# Patient Record
Sex: Male | Born: 1990 | Hispanic: No | Marital: Single | Smoking: Never smoker
Health system: Southern US, Community
[De-identification: ages and names within clinical notes are randomized; demographics above are authoritative.]

## PROBLEM LIST (undated history)

## (undated) DIAGNOSIS — Q211 Atrial septal defect, unspecified: Secondary | ICD-10-CM

## (undated) DIAGNOSIS — E119 Type 2 diabetes mellitus without complications: Secondary | ICD-10-CM

## (undated) DIAGNOSIS — Q2111 Secundum atrial septal defect: Secondary | ICD-10-CM

## (undated) HISTORY — PX: ATRIAL SEPTAL DEFECT(ASD) CLOSURE: CATH118299

## (undated) HISTORY — DX: Atrial septal defect: Q21.1

## (undated) HISTORY — DX: Secundum atrial septal defect: Q21.11

---

## 2011-02-20 ENCOUNTER — Inpatient Hospital Stay (INDEPENDENT_AMBULATORY_CARE_PROVIDER_SITE_OTHER)
Admission: RE | Admit: 2011-02-20 | Discharge: 2011-02-20 | Disposition: A | Discharge status: Home / Self Care | Payer: Self-pay | Source: Ambulatory Visit | Attending: Family Medicine | Admitting: Family Medicine

## 2011-02-20 DIAGNOSIS — E119 Type 2 diabetes mellitus without complications: Secondary | ICD-10-CM

## 2012-12-11 ENCOUNTER — Ambulatory Visit: Payer: Self-pay | Admitting: Endocrinology

## 2012-12-18 ENCOUNTER — Ambulatory Visit (INDEPENDENT_AMBULATORY_CARE_PROVIDER_SITE_OTHER): Payer: 59 | Admitting: Endocrinology

## 2012-12-18 ENCOUNTER — Encounter: Payer: Self-pay | Admitting: Endocrinology

## 2012-12-18 VITALS — BP 126/80 | HR 80 | Ht 68.0 in | Wt 166.0 lb

## 2012-12-18 DIAGNOSIS — E109 Type 1 diabetes mellitus without complications: Secondary | ICD-10-CM

## 2012-12-18 MED ORDER — INSULIN GLARGINE 100 UNIT/ML ~~LOC~~ SOLN: 25.0000 [IU] | Freq: Every day | SUBCUTANEOUS | Status: DC

## 2012-12-18 MED ORDER — INSULIN LISPRO 100 UNIT/ML ~~LOC~~ SOLN: 12.0000 [IU] | Freq: Three times a day (TID) | SUBCUTANEOUS | Status: DC

## 2012-12-18 NOTE — Progress Notes (Signed)
  Subjective:    Patient ID: Kenneth James, male    DOB: 01-31-1991, 22 y.o.   MRN: [id number]  HPI pt states 15 years h/o dm.  He has mild if any neuropathy of the lower extremities; no associated chronic complications.  pt says his diet and exercise are good.  He quit smoking a few months ago.  No past medical history on file.  No past surgical history on file.  History   Social History  . Marital Status: Single    Spouse Name: N/A    Number of Children: N/A  . Years of Education: N/A   Occupational History  . Not on file.   Social History Main Topics  . Smoking status: Not on file  . Smokeless tobacco: Not on file  . Alcohol Use: Not on file  . Drug Use: Not on file  . Sexually Active: Not on file   Other Topics Concern  . Not on file   Social History Narrative  . No narrative on file    No current outpatient prescriptions on file prior to visit.   No current facility-administered medications on file prior to visit.   Not on File  No family history on file. Sister has type 1 DM BP 126/80  Pulse 80  Ht 5\' 8"  (1.727 m)  Wt 166 lb (75.297 kg)  BMI 25.25 kg/m2  SpO2 95%  Review of Systems denies blurry vision, headache, chest pain, sob, n/v, urinary frequency, cramps, excessive diaphoresis, depression, rhinorrhea, and easy bruising.  He has lost a few lbs, due to his efforts.  He has hypoglycemia with exercise.      Objective:   Physical Exam VS: see vs page GEN: no distress HEAD: head: no deformity eyes: no periorbital swelling, no proptosis external nose and ears are normal mouth: no lesion seen NECK: supple, thyroid is not enlarged CHEST WALL: no deformity LUNGS: clear to auscultation BREASTS:  No gynecomastia CV: reg rate and rhythm, no murmur ABD: abdomen is soft, nontender.  no hepatosplenomegaly.  not distended.  no hernia MUSCULOSKELETAL: muscle bulk and strength are grossly normal.  no obvious joint swelling.  gait is normal and  steady EXTEMITIES: no deformity.  no ulcer on the feet.  feet are of normal color and temp.  no edema PULSES: dorsalis pedis intact bilat.  no carotid bruit NEURO:  cn 2-12 grossly intact.   readily moves all 4's.  sensation is intact to touch on the feet SKIN:  Normal texture and temperature.  No rash or suspicious lesion is visible.   NODES:  None palpable at the neck PSYCH: alert, oriented x3.  Does not appear anxious nor depressed.   outside test results are reviewed: A1c=7.6%    Assessment & Plan:  Type 1 DM: This new insulin regimen was chosen from multiple options, as it best matches his insulin to his changing requirements throughout the day.  The benefits of glycemic control must be weighed against the risks of hypoglycemia.   Weight loss.  This helps control his DM Smoker, quit. Quitting also helps mitigate complications of DM.

## 2012-12-18 NOTE — Patient Instructions (Addendum)
good diet and exercise habits significanly improve the control of your diabetes.  please let me know if you wish to be referred to a dietician.  high blood sugar is very risky to your health.  you should see an eye doctor every year.  You are at higher than average risk for pneumonia and hepatitis-B.  You should be vaccinated against both.   controlling your blood pressure and cholesterol drastically reduces the damage diabetes does to your body.  this also applies to quitting smoking.  please discuss these with your doctor.  check your blood sugar 4 times a day: before the 3 meals, and at bedtime.  also check if you have symptoms of your blood sugar being too high or too low.  please keep a record of the readings and bring it to your next appointment here.  please call us sooner if your blood sugar goes below 70, or if you have a lot of readings over 200.   Please change your current insulin to: Lantus and humalog Please come back for a follow-up appointment in 3-4 weeks.

## 2012-12-30 ENCOUNTER — Encounter: Payer: Self-pay | Admitting: Family Medicine

## 2013-01-15 ENCOUNTER — Ambulatory Visit: Payer: 59 | Admitting: Endocrinology

## 2013-01-15 DIAGNOSIS — Z0289 Encounter for other administrative examinations: Secondary | ICD-10-CM

## 2013-03-22 ENCOUNTER — Emergency Department (HOSPITAL_COMMUNITY)
Admission: EM | Admit: 2013-03-22 | Discharge: 2013-03-22 | Disposition: A | Discharge status: Home / Self Care | Payer: Managed Care, Other (non HMO)

## 2013-03-22 NOTE — ED Notes (Signed)
No answer when called pt; pt not in waiting room

## 2013-03-22 NOTE — ED Notes (Signed)
Pt left and went to [facility]; need to be removed so can be triaged at [facility]

## 2013-03-23 ENCOUNTER — Encounter (HOSPITAL_COMMUNITY): Payer: Self-pay | Admitting: *Deleted

## 2013-03-23 ENCOUNTER — Emergency Department (HOSPITAL_COMMUNITY): Payer: Managed Care, Other (non HMO)

## 2013-03-23 ENCOUNTER — Emergency Department (HOSPITAL_COMMUNITY)
Admission: EM | Admit: 2013-03-23 | Discharge: 2013-03-24 | Disposition: A | Discharge status: Home / Self Care | Payer: Managed Care, Other (non HMO) | Attending: Emergency Medicine | Admitting: Emergency Medicine

## 2013-03-23 DIAGNOSIS — R079 Chest pain, unspecified: Secondary | ICD-10-CM | POA: Insufficient documentation

## 2013-03-23 DIAGNOSIS — J209 Acute bronchitis, unspecified: Secondary | ICD-10-CM | POA: Insufficient documentation

## 2013-03-23 DIAGNOSIS — R7309 Other abnormal glucose: Secondary | ICD-10-CM | POA: Insufficient documentation

## 2013-03-23 DIAGNOSIS — R739 Hyperglycemia, unspecified: Secondary | ICD-10-CM

## 2013-03-23 DIAGNOSIS — J208 Acute bronchitis due to other specified organisms: Secondary | ICD-10-CM

## 2013-03-23 DIAGNOSIS — E119 Type 2 diabetes mellitus without complications: Secondary | ICD-10-CM | POA: Insufficient documentation

## 2013-03-23 DIAGNOSIS — Z794 Long term (current) use of insulin: Secondary | ICD-10-CM | POA: Insufficient documentation

## 2013-03-23 HISTORY — DX: Type 2 diabetes mellitus without complications: E11.9

## 2013-03-23 NOTE — ED Notes (Addendum)
Pt states productive cough and URI type symptoms for the past 2 days. Pt states nasal congestion as well. Pt denies fever or chills within the last 24 hours.

## 2013-03-24 LAB — GLUCOSE, CAPILLARY: Glucose-Capillary: 259 mg/dL — ABNORMAL HIGH (ref 70–99)

## 2013-03-24 LAB — POCT I-STAT, CHEM 8
BUN: 22 mg/dL (ref 6–23)
Calcium, Ion: 0.9 mmol/L — ABNORMAL LOW (ref 1.12–1.23)
Chloride: 107 mEq/L (ref 96–112)
Creatinine, Ser: 0.8 mg/dL (ref 0.50–1.35)
Glucose, Bld: 211 mg/dL — ABNORMAL HIGH (ref 70–99)

## 2013-03-24 MED ORDER — ALBUTEROL SULFATE HFA 108 (90 BASE) MCG/ACT IN AERS
2.0000 | INHALATION_SPRAY | Freq: Once | RESPIRATORY_TRACT | Status: AC
  Administered 2013-03-24: 2 via RESPIRATORY_TRACT
  Filled 2013-03-24 (×2): qty 6.7

## 2013-03-24 MED ORDER — HYDROCOD POLST-CHLORPHEN POLST 10-8 MG/5ML PO LQCR: 5.0000 mL | Freq: Two times a day (BID) | ORAL | Status: DC | PRN

## 2013-03-24 NOTE — ED Notes (Signed)
Pt states that he feels light headed CBG checked

## 2013-03-24 NOTE — ED Provider Notes (Signed)
Medical screening examination/treatment/procedure(s) were performed by non-physician practitioner and as supervising physician I was immediately available for consultation/collaboration.  Olivia Mackie, MD 03/24/13 [phone]

## 2013-03-24 NOTE — ED Provider Notes (Signed)
CSN: [id number]     Arrival date & time 03/23/13  2223 History   None    Chief Complaint  Patient presents with  . URI   (Consider location/radiation/quality/duration/timing/severity/associated sxs/prior Treatment) HPI History provided by pt.   Pt presents w/ cough, productive of yellow sputum, x 2 days.  Has diffuse pain in chest when he coughs.  Associated w/ nasal congestion and wheezing, as well as shortness of breath upon waking in am.  Denies fever, rhinorrhea, sore throat and hemoptysis.  Known sick contacts.  Smokes cigarettes.  H/o Type 1 diabetes; compliant w/ meds. Past Medical History  Diagnosis Date  . Diabetes mellitus without complication    History reviewed. No pertinent past surgical history. History reviewed. No pertinent family history. History  Substance Use Topics  . Smoking status: Never Smoker   . Smokeless tobacco: Not on file  . Alcohol Use: No    Review of Systems  All other systems reviewed and are negative.    Allergies  Review of patient's allergies indicates no known allergies.  Home Medications   Current Outpatient Rx  Name  Route  Sig  Dispense  Refill  . insulin glargine (LANTUS) 100 UNIT/ML injection   Subcutaneous   Inject 0.25 mLs (25 Units total) into the skin at bedtime.   10 mL   12   . insulin NPH-regular (NOVOLIN 70/30) (70-30) 100 UNIT/ML injection   Subcutaneous   Inject 40 Units into the skin 2 (two) times daily with a meal.         . chlorpheniramine-HYDROcodone (TUSSIONEX PENNKINETIC ER) 10-8 MG/5ML LQCR   Oral   Take 5 mLs by mouth every 12 (twelve) hours as needed.   40 mL   0    BP 132/77  Pulse 112  Temp(Src) 98.1 F (36.7 C) (Oral)  Resp 16  SpO2 96% Physical Exam  Nursing note and vitals reviewed. Constitutional: He is oriented to person, place, and time. He appears well-developed and well-nourished. No distress.  HENT:  Head: Normocephalic and atraumatic.  Posterior pharynx w/out erythema.  No  tonsillar edema or exudate.  Uvula mid-line.  No trismus.    Eyes:  Normal appearance  Neck: Normal range of motion.  Cardiovascular: Normal rate and regular rhythm.   Pulmonary/Chest: Effort normal and breath sounds normal. No respiratory distress.  Musculoskeletal: Normal range of motion.  Lymphadenopathy:    He has cervical adenopathy.  Neurological: He is alert and oriented to person, place, and time.  Skin: Skin is warm and dry. No rash noted.  Psychiatric: He has a normal mood and affect. His behavior is normal.    ED Course  Procedures (including critical care time) Labs Review Labs Reviewed  GLUCOSE, CAPILLARY - Abnormal; Notable for the following:    Glucose-Capillary 259 (*)    All other components within normal limits  GLUCOSE, CAPILLARY - Abnormal; Notable for the following:    Glucose-Capillary 201 (*)    All other components within normal limits  POCT I-STAT, CHEM 8 - Abnormal; Notable for the following:    Glucose, Bld 211 (*)    Calcium, Ion 0.90 (*)    All other components within normal limits   Imaging Review Dg Chest 2 View  03/23/2013   CLINICAL DATA:  Chest congestion.  EXAM: CHEST  2 VIEW  COMPARISON:  None.  FINDINGS: The cardiac silhouette, mediastinal and hilar contours are normal. Mild peribronchial thickening and slight increased interstitial markings suggesting bronchitis but no focal infiltrates or  effusions. The bony thorax is intact.  IMPRESSION: Probable mild bronchitis. No focal infiltrates.   Electronically Signed   By: Loralie Champagne M.D.   On: 03/23/2013 22:51    MDM   1. Viral bronchitis   2. Hyperglycemia    22yo type 1 diabetic presents w/ cough, wheezing, nasal congestion as well as SOB upon waking in am x 2d.  Afebrile, no respiratory distress, no coughing, nml breath sounds on exam.  CXR consistent w/ bronchitis.  Results discussed w/ pt.  Cbg 259.  Hyperglycemic w/out acidosis.  Has been compliant w/ meds.  Recommended f/u with PCP.   Provided him w/ albuterol inhaler and prescribed short course of tussionex.  He will try to cut back on smoking.    Otilio Miu, PA-C 03/24/13 [phone]

## 2013-03-24 NOTE — ED Notes (Signed)
Educated Pt on indications and use of inhaler

## 2013-03-24 NOTE — ED Notes (Signed)
Pt states that he is feeling less dizzy.

## 2013-03-24 NOTE — ED Notes (Signed)
Pt a current smoker

## 2013-07-08 ENCOUNTER — Emergency Department (HOSPITAL_COMMUNITY)
Admission: EM | Admit: 2013-07-08 | Discharge: 2013-07-08 | Disposition: A | Discharge status: Home / Self Care | Payer: Managed Care, Other (non HMO) | Attending: Emergency Medicine | Admitting: Emergency Medicine

## 2013-07-08 ENCOUNTER — Encounter (HOSPITAL_COMMUNITY): Payer: Self-pay | Admitting: Emergency Medicine

## 2013-07-08 ENCOUNTER — Emergency Department (HOSPITAL_COMMUNITY): Payer: Managed Care, Other (non HMO)

## 2013-07-08 DIAGNOSIS — E119 Type 2 diabetes mellitus without complications: Secondary | ICD-10-CM | POA: Insufficient documentation

## 2013-07-08 DIAGNOSIS — F17200 Nicotine dependence, unspecified, uncomplicated: Secondary | ICD-10-CM | POA: Insufficient documentation

## 2013-07-08 DIAGNOSIS — J111 Influenza due to unidentified influenza virus with other respiratory manifestations: Secondary | ICD-10-CM

## 2013-07-08 DIAGNOSIS — Z794 Long term (current) use of insulin: Secondary | ICD-10-CM | POA: Insufficient documentation

## 2013-07-08 MED ORDER — IBUPROFEN 800 MG PO TABS
800.0000 mg | ORAL_TABLET | Freq: Once | ORAL | Status: AC
  Administered 2013-07-08: 800 mg via ORAL
  Filled 2013-07-08: qty 1

## 2013-07-08 MED ORDER — ALBUTEROL SULFATE HFA 108 (90 BASE) MCG/ACT IN AERS
2.0000 | INHALATION_SPRAY | RESPIRATORY_TRACT | Status: DC | PRN
  Filled 2013-07-08: qty 6.7

## 2013-07-08 MED ORDER — CETIRIZINE-PSEUDOEPHEDRINE ER 5-120 MG PO TB12: 1.0000 | ORAL_TABLET | Freq: Two times a day (BID) | ORAL | Status: DC

## 2013-07-08 MED ORDER — GUAIFENESIN ER 600 MG PO TB12: 1200.0000 mg | ORAL_TABLET | Freq: Two times a day (BID) | ORAL | Status: DC

## 2013-07-08 MED ORDER — OSELTAMIVIR PHOSPHATE 75 MG PO CAPS: 75.0000 mg | ORAL_CAPSULE | Freq: Two times a day (BID) | ORAL | Status: DC

## 2013-07-08 NOTE — ED Provider Notes (Signed)
CSN: [id number]     Arrival date & time 07/08/13  1926 History  This chart was scribed for non-physician practitioner, Ivonne AndrewPeter Joren Rehm, PA-C,working with Rolland PorterMark James, MD, by Karle PlumberJennifer Tensley, ED Scribe.  This patient was seen in room WTR9/WTR9 and the patient's care was started at 8:19 PM.  Chief Complaint  Patient presents with  . Influenza   The history is provided by the patient. No language interpreter was used.   HPI Comments:  Kenneth James is a 23 y.o. male who presents to the Emergency Department complaining of  severe coughing for approximately 2 weeks. He states in the past three days his cough has become productive. He reports associated subjective fever starting yesterday, headache secondary to cough, and generalized muscle weakness and sore throat. He reports taking a medication and cough medication [facility], but cannot remember the name. He states he felt worse after taking the medications. He reports smoking 1 PPD. He reports h/o DM.    Past Medical History  Diagnosis Date  . Diabetes mellitus without complication    History reviewed. No pertinent past surgical history. Family History  Problem Relation Age of Onset  . Diabetes Other    History  Substance Use Topics  . Smoking status: Current Every Day Smoker    Types: Cigarettes  . Smokeless tobacco: Not on file  . Alcohol Use: Yes     Comment: occ    Review of Systems  Constitutional: Positive for fever.       Generalized body aches  HENT: Positive for congestion and sore throat.   Respiratory: Positive for cough.   Gastrointestinal: Negative for vomiting.       Reports loose bowel movements today.  Neurological: Positive for weakness (generalized) and headaches.  All other systems reviewed and are negative.    Allergies  Review of patient's allergies indicates no known allergies.  Home Medications   Current Outpatient Rx  Name  Route  Sig  Dispense  Refill  .  chlorpheniramine-HYDROcodone (TUSSIONEX PENNKINETIC ER) 10-8 MG/5ML LQCR   Oral   Take 5 mLs by mouth every 12 (twelve) hours as needed.   40 mL   0   . insulin glargine (LANTUS) 100 UNIT/ML injection   Subcutaneous   Inject 0.25 mLs (25 Units total) into the skin at bedtime.   10 mL   12   . insulin NPH-regular (NOVOLIN 70/30) (70-30) 100 UNIT/ML injection   Subcutaneous   Inject 40 Units into the skin 2 (two) times daily with a meal.          Triage Vitals: BP 119/68  Pulse 94  Temp(Src) 99.5 F (37.5 C) (Oral)  Resp 15  Ht 5\' 9"  (1.753 m)  Wt 170 lb (77.111 kg)  BMI 25.09 kg/m2  SpO2 96% Physical Exam  Nursing note and vitals reviewed. Constitutional: He is oriented to person, place, and time. He appears well-developed and well-nourished.  HENT:  Head: Normocephalic and atraumatic.  Right Ear: Hearing, tympanic membrane, external ear and ear canal normal.  Left Ear: Hearing, tympanic membrane, external ear and ear canal normal.  Mouth/Throat: Uvula is midline and mucous membranes are normal. Posterior oropharyngeal erythema present. No oropharyngeal exudate, posterior oropharyngeal edema or tonsillar abscesses.  Tonsillar cobblestoning.  Eyes: EOM are normal.  Neck: Normal range of motion.  Cardiovascular: Normal rate, regular rhythm and normal heart sounds.  Exam reveals no gallop and no friction rub.   No murmur heard. Pulmonary/Chest: Effort  normal. No respiratory distress. He has wheezes (Mild course wheezing in right lung.). He has no rales. He exhibits no tenderness.  Musculoskeletal: Normal range of motion.  Neurological: He is alert and oriented to person, place, and time.  Skin: Skin is warm and dry.  Psychiatric: He has a normal mood and affect. His behavior is normal.    ED Course  Procedures    DIAGNOSTIC STUDIES: Oxygen Saturation is 96% on RA, adequate by my interpretation.   COORDINATION OF CARE: 8:29 PM- Advised pt to take Tylenol or  Ibuprofen for fever. Since pt is unsure of medications that were prescribed, will obtain CXR to rule out pneumonia. Pt verbalizes understanding and agrees to plan.  Imaging Review Dg Chest 2 View  07/08/2013   CLINICAL DATA:  Cough, congestion, fever  EXAM: CHEST  2 VIEW  COMPARISON:  Prior chest x-ray 03/23/2013  FINDINGS: The lungs are clear and negative for focal airspace consolidation, pulmonary edema or suspicious pulmonary nodule. No pleural effusion or pneumothorax. Cardiac and mediastinal contours are within normal limits. No acute fracture or lytic or blastic osseous lesions. The visualized upper abdominal bowel gas pattern is unremarkable.  IMPRESSION: No active cardiopulmonary disease.   Electronically Signed   By: Malachy Moan M.D.   On: 07/08/2013 20:42     MDM   1. Influenza      I personally performed the services described in this documentation, which was scribed in my presence. The recorded information has been reviewed and is accurate.    Angus Seller, PA-C 07/08/13 2057

## 2013-07-08 NOTE — ED Notes (Signed)
Pt states he has had cold like symptoms for the past two weeks but started running a fever two days ago  Pt states his sxs include cough, fever, body aches, nasal congestion  Pt states his bowel movements have been loose today but has not had vomiting

## 2013-07-08 NOTE — Discharge Instructions (Signed)
Your x-ray was normal today. At this time your providers feel that your fever, cough and congestion are caused from a viral infection and possibly the flu. Drink plenty of fluids and stay hydrated. Use the medications prescribed to help with your symptoms. Take Tylenol and ibuprofen for fever and body aches.    Influenza, Adult Influenza ("the flu") is a viral infection of the respiratory tract. It occurs more often in winter months because people spend more time in close contact with one another. Influenza can make you feel very sick. Influenza easily spreads from person to person (contagious). CAUSES  Influenza is caused by a virus that infects the respiratory tract. You can catch the virus by breathing in droplets from an infected person's cough or sneeze. You can also catch the virus by touching something that was recently contaminated with the virus and then touching your mouth, nose, or eyes. SYMPTOMS  Symptoms typically last 4 to 10 days and may include:  Fever.  Chills.  Headache, body aches, and muscle aches.  Sore throat.  Chest discomfort and cough.  Poor appetite.  Weakness or feeling tired.  Dizziness.  Nausea or vomiting. DIAGNOSIS  Diagnosis of influenza is often made based on your history and a physical exam. A nose or throat swab test can be done to confirm the diagnosis. RISKS AND COMPLICATIONS You may be at risk for a more severe case of influenza if you smoke cigarettes, have diabetes, have chronic heart disease (such as heart failure) or lung disease (such as asthma), or if you have a weakened immune system. Elderly people and pregnant women are also at risk for more serious infections. The most common complication of influenza is a lung infection (pneumonia). Sometimes, this complication can require emergency medical care and may be life-threatening. PREVENTION  An annual influenza vaccination (flu shot) is the best way to avoid getting influenza. An annual flu  shot is now routinely recommended for all adults in the [place] TREATMENT  In mild cases, influenza goes away on its own. Treatment is directed at relieving symptoms. For more severe cases, your caregiver may prescribe antiviral medicines to shorten the sickness. Antibiotic medicines are not effective, because the infection is caused by a virus, not by bacteria. HOME CARE INSTRUCTIONS  Only take over-the-counter or prescription medicines for pain, discomfort, or fever as directed by your caregiver.  Use a cool mist humidifier to make breathing easier.  Get plenty of rest until your temperature returns to normal. This usually takes 3 to 4 days.  Drink enough fluids to keep your urine clear or pale yellow.  Cover your mouth and nose when coughing or sneezing, and wash your hands well to avoid spreading the virus.  Stay home from work or school until your fever has been gone for at least 1 full day. SEEK MEDICAL CARE IF:   You have chest pain or a deep cough that worsens or produces more mucus.  You have nausea, vomiting, or diarrhea. SEEK IMMEDIATE MEDICAL CARE IF:   You have difficulty breathing, shortness of breath, or your skin or nails turn bluish.  You have severe neck pain or stiffness.  You have a severe headache, facial pain, or earache.  You have a worsening or recurring fever.  You have nausea or vomiting that cannot be controlled. MAKE SURE YOU:  Understand these instructions.  Will watch your condition.  Will get help right away if you are not doing well or get worse. Document Released: 06/08/2000 Document Revised:  12/11/2011 Document Reviewed: 09/10/2011 ExitCare Patient Information 2014 [place], [place].

## 2013-07-08 NOTE — ED Notes (Signed)
Patient transported to X-ray 

## 2013-07-11 NOTE — ED Provider Notes (Signed)
Medical screening examination/treatment/procedure(s) were performed by non-physician practitioner and as supervising physician I was immediately available for consultation/collaboration.  EKG Interpretation   None         Ariaunna Longsworth, MD 07/11/13 0711 

## 2013-07-19 ENCOUNTER — Encounter (HOSPITAL_COMMUNITY): Payer: Self-pay | Admitting: Emergency Medicine

## 2013-07-19 ENCOUNTER — Emergency Department (HOSPITAL_COMMUNITY)
Admission: EM | Admit: 2013-07-19 | Discharge: 2013-07-20 | Discharge status: Against Medical Advice | Payer: Managed Care, Other (non HMO) | Attending: Emergency Medicine | Admitting: Emergency Medicine

## 2013-07-19 DIAGNOSIS — F17200 Nicotine dependence, unspecified, uncomplicated: Secondary | ICD-10-CM | POA: Insufficient documentation

## 2013-07-19 DIAGNOSIS — E119 Type 2 diabetes mellitus without complications: Secondary | ICD-10-CM | POA: Insufficient documentation

## 2013-07-19 DIAGNOSIS — Z760 Encounter for issue of repeat prescription: Secondary | ICD-10-CM | POA: Insufficient documentation

## 2013-07-19 DIAGNOSIS — Z794 Long term (current) use of insulin: Secondary | ICD-10-CM | POA: Insufficient documentation

## 2013-07-19 NOTE — ED Notes (Signed)
Per pt, needs refill script for insulin 70/30.  Out of med since yesterday.  [facility]

## 2013-09-16 ENCOUNTER — Encounter (HOSPITAL_COMMUNITY): Payer: Self-pay | Admitting: Emergency Medicine

## 2013-09-16 ENCOUNTER — Emergency Department (HOSPITAL_COMMUNITY)
Admission: EM | Admit: 2013-09-16 | Discharge: 2013-09-16 | Disposition: A | Discharge status: Home / Self Care | Payer: Managed Care, Other (non HMO) | Attending: Emergency Medicine | Admitting: Emergency Medicine

## 2013-09-16 DIAGNOSIS — Z794 Long term (current) use of insulin: Secondary | ICD-10-CM | POA: Insufficient documentation

## 2013-09-16 DIAGNOSIS — E1169 Type 2 diabetes mellitus with other specified complication: Secondary | ICD-10-CM | POA: Insufficient documentation

## 2013-09-16 DIAGNOSIS — Z79899 Other long term (current) drug therapy: Secondary | ICD-10-CM | POA: Insufficient documentation

## 2013-09-16 DIAGNOSIS — E162 Hypoglycemia, unspecified: Secondary | ICD-10-CM

## 2013-09-16 DIAGNOSIS — R51 Headache: Secondary | ICD-10-CM | POA: Insufficient documentation

## 2013-09-16 DIAGNOSIS — F17200 Nicotine dependence, unspecified, uncomplicated: Secondary | ICD-10-CM | POA: Insufficient documentation

## 2013-09-16 DIAGNOSIS — R5383 Other fatigue: Secondary | ICD-10-CM

## 2013-09-16 DIAGNOSIS — R5381 Other malaise: Secondary | ICD-10-CM | POA: Insufficient documentation

## 2013-09-16 LAB — CBG MONITORING, ED
GLUCOSE-CAPILLARY: 112 mg/dL — AB (ref 70–99)
GLUCOSE-CAPILLARY: 53 mg/dL — AB (ref 70–99)
Glucose-Capillary: 59 mg/dL — ABNORMAL LOW (ref 70–99)
Glucose-Capillary: 93 mg/dL (ref 70–99)

## 2013-09-16 MED ORDER — IBUPROFEN 200 MG PO TABS
400.0000 mg | ORAL_TABLET | Freq: Once | ORAL | Status: AC
Start: 2013-09-16 — End: 2013-09-16
  Administered 2013-09-16: 400 mg via ORAL
  Filled 2013-09-16: qty 2

## 2013-09-16 MED ORDER — GLUCOSE 40 % PO GEL
ORAL | Status: AC
  Administered 2013-09-16: 37.5 g
  Filled 2013-09-16: qty 1

## 2013-09-16 NOTE — Discharge Instructions (Signed)

## 2013-09-16 NOTE — ED Notes (Signed)
Pt reports he woke up with a headache 6/10, then gave himself his insulin 70/30 40 units. Then pt checked his blood sugar at home 45. Pt drank juice and then came to ED. In ED cbg 53. Oral glucose given. Pt alert and oriented x4.

## 2013-09-16 NOTE — ED Provider Notes (Signed)
CSN: [id number]     Arrival date & time 09/16/13  1033 History   First MD Initiated Contact with Patient 09/16/13 1156     Chief Complaint  Patient presents with  . Hypoglycemia  . Headache     (Consider location/radiation/quality/duration/timing/severity/associated sxs/prior Treatment) HPI Comments: Patient presents with low blood sugar. He has a history of insulin-dependent diabetes. He states he woke up this morning and had a little bit of a headache which she's had in the past. He routinely took his insulin shot and then didn't eat after that. He felt that his blood sugar was getting low he checked it and it was in the 50s. He came here for evaluation. His sugar was low in triage and was given some oral glucose. He felt much better after that. He was ready to go home and wanted to be discharged immediately. However when we went back to check his sugar it dropped from 112 back down into the 50s. At this point I encouraged him to stay a little but longer and he was given a sandwich and was also given juice with this. He denies any other symptoms. He states the headache is in the right side of his head and he had similar headaches in the past. He denies any neck pain fevers vomiting or vision changes. He has no neurologic deficits.  Patient is a 23 y.o. male presenting with hypoglycemia and headaches.  Hypoglycemia Associated symptoms: no dizziness, no shortness of breath, no speech difficulty, no sweats and no vomiting   Headache Associated symptoms: fatigue   Associated symptoms: no abdominal pain, no back pain, no congestion, no cough, no diarrhea, no dizziness, no fever, no nausea, no numbness and no vomiting     Past Medical History  Diagnosis Date  . Diabetes mellitus without complication    History reviewed. No pertinent past surgical history. Family History  Problem Relation Age of Onset  . Diabetes Other    History  Substance Use Topics  . Smoking status: Current Every Day  Smoker    Types: Cigarettes  . Smokeless tobacco: Not on file  . Alcohol Use: Yes     Comment: occ    Review of Systems  Constitutional: Positive for fatigue. Negative for fever, chills and diaphoresis.  HENT: Negative for congestion, rhinorrhea and sneezing.   Eyes: Negative.   Respiratory: Negative for cough, chest tightness and shortness of breath.   Cardiovascular: Negative for chest pain and leg swelling.  Gastrointestinal: Negative for nausea, vomiting, abdominal pain, diarrhea and blood in stool.  Genitourinary: Negative for frequency, hematuria, flank pain and difficulty urinating.  Musculoskeletal: Negative for arthralgias and back pain.  Skin: Negative for rash.  Neurological: Positive for headaches. Negative for dizziness, speech difficulty, weakness and numbness.      Allergies  Review of patient's allergies indicates no known allergies.  Home Medications   Current Outpatient Rx  Name  Route  Sig  Dispense  Refill  . BIOTIN PO   Oral   Take 1 tablet by mouth daily.         . insulin NPH-regular (NOVOLIN 70/30) (70-30) 100 UNIT/ML injection   Subcutaneous   Inject 40 Units into the skin 2 (two) times daily with a meal.          BP 112/65  Pulse 90  Temp(Src) 97.8 F (36.6 C) (Oral)  Resp 18  SpO2 100% Physical Exam  Constitutional: He is oriented to person, place, and time. He appears well-developed and  well-nourished.  HENT:  Head: Normocephalic and atraumatic.  Eyes: Pupils are equal, round, and reactive to light.  Neck: Normal range of motion. Neck supple.  Cardiovascular: Normal rate, regular rhythm and normal heart sounds.   Pulmonary/Chest: Effort normal and breath sounds normal. No respiratory distress. He has no wheezes. He has no rales. He exhibits no tenderness.  Abdominal: Soft. Bowel sounds are normal. There is no tenderness. There is no rebound and no guarding.  Musculoskeletal: Normal range of motion. He exhibits no edema.   Lymphadenopathy:    He has no cervical adenopathy.  Neurological: He is alert and oriented to person, place, and time. He has normal strength. No cranial nerve deficit or sensory deficit. GCS eye subscore is 4. GCS verbal subscore is 5. GCS motor subscore is 6.  Skin: Skin is warm and dry. No rash noted.  Psychiatric: He has a normal mood and affect.    ED Course  Procedures (including critical care time) Labs Review Labs Reviewed  CBG MONITORING, ED - Abnormal; Notable for the following:    Glucose-Capillary 53 (*)    All other components within normal limits  CBG MONITORING, ED - Abnormal; Notable for the following:    Glucose-Capillary 112 (*)    All other components within normal limits  CBG MONITORING, ED - Abnormal; Notable for the following:    Glucose-Capillary 59 (*)    All other components within normal limits  CBG MONITORING, ED   Imaging Review No results found.   EKG Interpretation None      MDM   Final diagnoses:  Hypoglycemia    Patient presents with hypoglycemia. This is related to them taken insulin in the evening. He did have a repeat episode of hypoglycemia in the ED which resolved 38 something. I did encourage him that he should have a longer period of observation in the ED but he refused. He said he had to get to class and he was going to get something to eat as soon as he leaves the ED. His headache is slmost completely resolved since this morning.    Rolan BuccoMelanie Donyell Carrell, MD 09/16/13 [phone]

## 2013-10-24 ENCOUNTER — Emergency Department (HOSPITAL_COMMUNITY): Payer: Managed Care, Other (non HMO)

## 2013-10-24 ENCOUNTER — Encounter (HOSPITAL_COMMUNITY): Payer: Self-pay | Admitting: Emergency Medicine

## 2013-10-24 ENCOUNTER — Emergency Department (HOSPITAL_COMMUNITY)
Admission: EM | Admit: 2013-10-24 | Discharge: 2013-10-24 | Disposition: A | Discharge status: Home / Self Care | Payer: Managed Care, Other (non HMO) | Attending: Emergency Medicine | Admitting: Emergency Medicine

## 2013-10-24 DIAGNOSIS — J069 Acute upper respiratory infection, unspecified: Secondary | ICD-10-CM

## 2013-10-24 DIAGNOSIS — Z794 Long term (current) use of insulin: Secondary | ICD-10-CM | POA: Insufficient documentation

## 2013-10-24 DIAGNOSIS — F17200 Nicotine dependence, unspecified, uncomplicated: Secondary | ICD-10-CM | POA: Insufficient documentation

## 2013-10-24 DIAGNOSIS — E119 Type 2 diabetes mellitus without complications: Secondary | ICD-10-CM | POA: Insufficient documentation

## 2013-10-24 DIAGNOSIS — Z79899 Other long term (current) drug therapy: Secondary | ICD-10-CM | POA: Insufficient documentation

## 2013-10-24 MED ORDER — GUAIFENESIN 100 MG/5ML PO LIQD: 100.0000 mg | ORAL | Status: DC | PRN

## 2013-10-24 MED ORDER — GUAIFENESIN 100 MG/5ML PO SYRP
5.0000 mL | ORAL_SOLUTION | Freq: Once | ORAL | Status: AC
  Administered 2013-10-24: 5 mL via ORAL
  Filled 2013-10-24: qty 5

## 2013-10-24 MED ORDER — ALBUTEROL SULFATE HFA 108 (90 BASE) MCG/ACT IN AERS
2.0000 | INHALATION_SPRAY | Freq: Once | RESPIRATORY_TRACT | Status: DC
  Filled 2013-10-24: qty 6.7

## 2013-10-24 NOTE — ED Notes (Signed)
Pt states he feels like he has inflammation in his chest and a cough  Pt states sxs started yesterday and are worse today

## 2013-10-24 NOTE — Discharge Instructions (Signed)
Please follow up with your primary care physician in 1-2 days. If you do not have one please call the [facility] number listed above. Please use inhaler 2 puffs every four to six hours to help with cough and chest tightness. Please take Robitussin as prescribed. Please finish your course of antibiotics for your strep throat as provided by a previous provider. Please read all discharge instructions and return precautions.   Upper Respiratory Infection, Adult An upper respiratory infection (URI) is also sometimes known as the common cold. The upper respiratory tract includes the nose, sinuses, throat, trachea, and bronchi. Bronchi are the airways leading to the lungs. Most people improve within 1 week, but symptoms can last up to 2 weeks. A residual cough may last even longer.  CAUSES Many different viruses can infect the tissues lining the upper respiratory tract. The tissues become irritated and inflamed and often become very moist. Mucus production is also common. A cold is contagious. You can easily spread the virus to others by oral contact. This includes kissing, sharing a glass, coughing, or sneezing. Touching your mouth or nose and then touching a surface, which is then touched by another person, can also spread the virus. SYMPTOMS  Symptoms typically develop 1 to 3 days after you come in contact with a cold virus. Symptoms vary from person to person. They may include:  Runny nose.  Sneezing.  Nasal congestion.  Sinus irritation.  Sore throat.  Loss of voice (laryngitis).  Cough.  Fatigue.  Muscle aches.  Loss of appetite.  Headache.  Low-grade fever. DIAGNOSIS  You might diagnose your own cold based on familiar symptoms, since most people get a cold 2 to 3 times a year. Your caregiver can confirm this based on your exam. Most importantly, your caregiver can check that your symptoms are not due to another disease such as strep throat, sinusitis, pneumonia,  asthma, or epiglottitis. Blood tests, throat tests, and X-rays are not necessary to diagnose a common cold, but they may sometimes be helpful in excluding other more serious diseases. Your caregiver will decide if any further tests are required. RISKS AND COMPLICATIONS  You may be at risk for a more severe case of the common cold if you smoke cigarettes, have chronic heart disease (such as heart failure) or lung disease (such as asthma), or if you have a weakened immune system. The very young and very old are also at risk for more serious infections. Bacterial sinusitis, middle ear infections, and bacterial pneumonia can complicate the common cold. The common cold can worsen asthma and chronic obstructive pulmonary disease (COPD). Sometimes, these complications can require emergency medical care and may be life-threatening. PREVENTION  The best way to protect against getting a cold is to practice good hygiene. Avoid oral or hand contact with people with cold symptoms. Wash your hands often if contact occurs. There is no clear evidence that vitamin C, vitamin E, echinacea, or exercise reduces the chance of developing a cold. However, it is always recommended to get plenty of rest and practice good nutrition. TREATMENT  Treatment is directed at relieving symptoms. There is no cure. Antibiotics are not effective, because the infection is caused by a virus, not by bacteria. Treatment may include:  Increased fluid intake. Sports drinks offer valuable electrolytes, sugars, and fluids.  Breathing heated mist or steam (vaporizer or shower).  Eating chicken soup or other clear broths, and maintaining good nutrition.  Getting plenty of rest.  Using gargles or  lozenges for comfort.  Controlling fevers with ibuprofen or acetaminophen as directed by your caregiver.  Increasing usage of your inhaler if you have asthma. Zinc gel and zinc lozenges, taken in the first 24 hours of the common cold, can shorten the  duration and lessen the severity of symptoms. Pain medicines may help with fever, muscle aches, and throat pain. A variety of non-prescription medicines are available to treat congestion and runny nose. Your caregiver can make recommendations and may suggest nasal or lung inhalers for other symptoms.  HOME CARE INSTRUCTIONS   Only take over-the-counter or prescription medicines for pain, discomfort, or fever as directed by your caregiver.  Use a warm mist humidifier or inhale steam from a shower to increase air moisture. This may keep secretions moist and make it easier to breathe.  Drink enough water and fluids to keep your urine clear or pale yellow.  Rest as needed.  Return to work when your temperature has returned to normal or as your caregiver advises. You may need to stay home longer to avoid infecting others. You can also use a face mask and careful hand washing to prevent spread of the virus. SEEK MEDICAL CARE IF:   After the first few days, you feel you are getting worse rather than better.  You need your caregiver's advice about medicines to control symptoms.  You develop chills, worsening shortness of breath, or brown or red sputum. These may be signs of pneumonia.  You develop yellow or brown nasal discharge or pain in the face, especially when you bend forward. These may be signs of sinusitis.  You develop a fever, swollen neck glands, pain with swallowing, or white areas in the back of your throat. These may be signs of strep throat. SEEK IMMEDIATE MEDICAL CARE IF:   You have a fever.  You develop severe or persistent headache, ear pain, sinus pain, or chest pain.  You develop wheezing, a prolonged cough, cough up blood, or have a change in your usual mucus (if you have chronic lung disease).  You develop sore muscles or a stiff neck. Document Released: 12/05/2000 Document Revised: 09/03/2011 Document Reviewed: 10/13/2010 [facility] Patient Information 2014 [place].

## 2013-10-24 NOTE — ED Provider Notes (Signed)
CSN: [id number]     Arrival date & time 10/24/13  2004 History   First MD Initiated Contact with Patient 10/24/13 2015     Chief Complaint  Patient presents with  . Cough     (Consider location/radiation/quality/duration/timing/severity/associated sxs/prior Treatment) HPI Comments: Patient is a 23 year old male past medical history significant for DM presented to the emergency department for 2 days of productive cough with yellow sputum, post tussive chest tightness, nasal congestion and rhinorrhea. Patient states he was seen at an urgent care and diagnosed with strep pharyngitis and started on an antibiotic. This is been taking this without difficulty. He states he is concerned he may have a pneumonia or other chest infection. He has not tried any over-the-counter cough and cold medications. No aggravating factors noted. He denies any fevers, chills, nausea, vomiting, diarrhea.  Patient is a 23 y.o. male presenting with cough.  Cough Associated symptoms: rhinorrhea   Associated symptoms: no chest pain, no chills, no fever and no shortness of breath     Past Medical History  Diagnosis Date  . Diabetes mellitus without complication    History reviewed. No pertinent past surgical history. Family History  Problem Relation Age of Onset  . Diabetes Other   . Diabetes Sister    History  Substance Use Topics  . Smoking status: Current Every Day Smoker    Types: Cigarettes  . Smokeless tobacco: Not on file  . Alcohol Use: Yes     Comment: occ    Review of Systems  Constitutional: Negative for fever and chills.  HENT: Positive for congestion and rhinorrhea.   Respiratory: Positive for cough and chest tightness. Negative for shortness of breath.   Cardiovascular: Negative for chest pain.  All other systems reviewed and are negative.     Allergies  Review of patient's allergies indicates no known allergies.  Home Medications   Prior to Admission medications   Medication Sig  Start Date End Date Taking? Authorizing Provider  insulin NPH-regular (NOVOLIN 70/30) (70-30) 100 UNIT/ML injection Inject 40 Units into the skin 2 (two) times daily with a meal.   Yes Historical Provider, MD  MINOXIDIL, TOPICAL, (MINOXIDIL FOR MEN) 5 % SOLN Apply 1 application topically daily.   Yes Historical Provider, MD   BP 123/67  Pulse 90  Temp(Src) 98.1 F (36.7 C) (Oral)  Resp 18  SpO2 98% Physical Exam  Nursing note and vitals reviewed. Constitutional: He is oriented to person, place, and time. He appears well-developed and well-nourished. No distress.  HENT:  Head: Normocephalic and atraumatic.  Right Ear: Hearing, tympanic membrane, external ear and ear canal normal. No mastoid tenderness.  Left Ear: Hearing, tympanic membrane, external ear and ear canal normal. No mastoid tenderness.  Nose: Rhinorrhea present. No mucosal edema, nose lacerations, sinus tenderness, nasal deformity, septal deviation or nasal septal hematoma. No epistaxis.  No foreign bodies. Right sinus exhibits no maxillary sinus tenderness and no frontal sinus tenderness. Left sinus exhibits no maxillary sinus tenderness and no frontal sinus tenderness.  Mouth/Throat: Uvula is midline, oropharynx is clear and moist and mucous membranes are normal. No trismus in the jaw. No tonsillar abscesses.  Eyes: Conjunctivae are normal.  Neck: Normal range of motion. Neck supple.  Cardiovascular: Normal rate, regular rhythm and normal heart sounds.   Pulmonary/Chest: Effort normal and breath sounds normal. No respiratory distress. He has no wheezes. He has no rales. He exhibits no tenderness.  Abdominal: Soft. There is no tenderness.  Musculoskeletal: Normal range of motion.  Neurological: He is alert and oriented to person, place, and time.  Skin: Skin is warm and dry. He is not diaphoretic.  Psychiatric: He has a normal mood and affect.    ED Course  Procedures (including critical care time) Medications  albuterol  (PROVENTIL HFA;VENTOLIN HFA) 108 (90 BASE) MCG/ACT inhaler 2 puff (not administered)  guaifenesin (ROBITUSSIN) 100 MG/5ML syrup 5 mL (5 mLs Oral Given 10/24/13 2158)    Labs Review Labs Reviewed - No data to display  Imaging Review Dg Chest 2 View  10/24/2013   CLINICAL DATA:  Cough, congestion, shortness of breath.  Smoker.  EXAM: CHEST  2 VIEW  COMPARISON:  DG CHEST 2 VIEW dated 07/08/2013  FINDINGS: The heart size and mediastinal contours are within normal limits. Both lungs are clear. The visualized skeletal structures are unremarkable.  IMPRESSION: No active cardiopulmonary disease.   Electronically Signed   By: Burman NievesWilliam  Stevens M.D.   On: 10/24/2013 21:05     EKG Interpretation None      MDM   Final diagnoses:  URI (upper respiratory infection)    Filed Vitals:   10/24/13 2010  BP: 123/67  Pulse: 90  Temp: 98.1 F (36.7 C)  Resp: 18   Afebrile, NAD, non-toxic appearing, AAOx4.  Pt CXR negative for acute infiltrate. Patients symptoms are consistent with URI, likely viral etiology. Discussed that antibiotics are not indicated for viral infections. Pt will be discharged with symptomatic treatment.  Verbalizes understanding and is agreeable with plan. Pt is hemodynamically stable & in NAD prior to dc. Return precautions discussed. Patient is agreeable to plan. Patient is stable at time of discharge.         Jeannetta EllisJennifer L Haven Foss, PA-C 10/24/13 2249

## 2013-10-25 NOTE — ED Provider Notes (Signed)
Medical screening examination/treatment/procedure(s) were performed by non-physician practitioner and as supervising physician I was immediately available for consultation/collaboration.   EKG Interpretation None        Adrine Hayworth H Malvika Tung, MD 10/25/13 1500 

## 2013-11-21 ENCOUNTER — Encounter (HOSPITAL_COMMUNITY): Payer: Self-pay | Admitting: Emergency Medicine

## 2013-11-21 ENCOUNTER — Emergency Department (HOSPITAL_COMMUNITY)
Admission: EM | Admit: 2013-11-21 | Discharge: 2013-11-21 | Disposition: A | Discharge status: Home / Self Care | Payer: Managed Care, Other (non HMO) | Attending: Emergency Medicine | Admitting: Emergency Medicine

## 2013-11-21 DIAGNOSIS — E109 Type 1 diabetes mellitus without complications: Secondary | ICD-10-CM | POA: Insufficient documentation

## 2013-11-21 DIAGNOSIS — R000 Tachycardia, unspecified: Secondary | ICD-10-CM | POA: Insufficient documentation

## 2013-11-21 DIAGNOSIS — F17200 Nicotine dependence, unspecified, uncomplicated: Secondary | ICD-10-CM | POA: Insufficient documentation

## 2013-11-21 DIAGNOSIS — Z760 Encounter for issue of repeat prescription: Secondary | ICD-10-CM | POA: Insufficient documentation

## 2013-11-21 DIAGNOSIS — Z794 Long term (current) use of insulin: Secondary | ICD-10-CM | POA: Insufficient documentation

## 2013-11-21 DIAGNOSIS — Z79899 Other long term (current) drug therapy: Secondary | ICD-10-CM | POA: Insufficient documentation

## 2013-11-21 MED ORDER — INSULIN NPH ISOPHANE & REGULAR (70-30) 100 UNIT/ML ~~LOC~~ SUSP: 40.0000 [IU] | Freq: Two times a day (BID) | SUBCUTANEOUS | Status: DC

## 2013-11-21 MED ORDER — INSULIN ASPART PROT & ASPART (70-30 MIX) 100 UNIT/ML ~~LOC~~ SUSP
40.0000 [IU] | Freq: Once | SUBCUTANEOUS | Status: AC
  Administered 2013-11-21: 40 [IU] via SUBCUTANEOUS
  Filled 2013-11-21: qty 10

## 2013-11-21 NOTE — ED Provider Notes (Signed)
Medical screening examination/treatment/procedure(s) were performed by non-physician practitioner and as supervising physician I was immediately available for consultation/collaboration.   EKG Interpretation None        Audree Camel, MD 11/21/13 [phone]

## 2013-11-21 NOTE — ED Provider Notes (Signed)
CSN: [id number]     Arrival date & time 11/21/13  1635 History   First MD Initiated Contact with Patient 11/21/13 1650     Chief Complaint  Patient presents with  . Medication Refill     (Consider location/radiation/quality/duration/timing/severity/associated sxs/prior Treatment) HPI  23 year old male with history of type 1 diabetes that is insulin-dependent who presents requesting for medication refill. Patient states he ran out of his insulin yesterday. He does have a primary care Dr. but office is closed today. He has no significant complaints except for being a little bit tired. He did not have his morning dose. He takes insulin 70/30, 40 units in the morning, and 30 units at night. He denies having headache, chest pain, shortness of breath, or pain, nausea vomiting diarrhea. No recent sickness.  Past Medical History  Diagnosis Date  . Diabetes mellitus without complication    No past surgical history on file. Family History  Problem Relation Age of Onset  . Diabetes Other   . Diabetes Sister    History  Substance Use Topics  . Smoking status: Current Every Day Smoker    Types: Cigarettes  . Smokeless tobacco: Not on file  . Alcohol Use: Yes     Comment: occ    Review of Systems  All other systems reviewed and are negative.     Allergies  Review of patient's allergies indicates no known allergies.  Home Medications   Prior to Admission medications   Medication Sig Start Date End Date Taking? Authorizing Provider  guaiFENesin (ROBITUSSIN) 100 MG/5ML liquid Take 5-10 mLs (100-200 mg total) by mouth every 4 (four) hours as needed for cough. 10/24/13   Jennifer L Piepenbrink, PA-C  insulin NPH-regular (NOVOLIN 70/30) (70-30) 100 UNIT/ML injection Inject 40 Units into the skin 2 (two) times daily with a meal.    Historical Provider, MD  MINOXIDIL, TOPICAL, (MINOXIDIL FOR MEN) 5 % SOLN Apply 1 application topically daily.    Historical Provider, MD   There were no vitals  taken for this visit. Physical Exam  Constitutional: He is oriented to person, place, and time. He appears well-developed and well-nourished. No distress.  HENT:  Head: Atraumatic.  Mouth/Throat: Oropharynx is clear and moist.  Eyes: Conjunctivae are normal.  Neck: Normal range of motion. Neck supple.  Cardiovascular:  Mild tachycardia with M/R/G  Pulmonary/Chest: Effort normal and breath sounds normal.  Abdominal: Soft.  Neurological: He is alert and oriented to person, place, and time. GCS eye subscore is 4. GCS verbal subscore is 5. GCS motor subscore is 6.  Skin: No rash noted.  Psychiatric: He has a normal mood and affect.    ED Course  Procedures (including critical care time)  4:58 PM Pt here for medication refill.  Will refill his insulin.  Pt will f/u with his PCP.  He's mentating appropriately, doubt DKA.  Since he missed his morning dose, will give a dose here. Pt to resume his medication.    Labs Review Labs Reviewed - No data to display  Imaging Review No results found.   EKG Interpretation None      MDM   Final diagnoses:  Encounter for medication refill    BP 143/67  Pulse 110  Temp(Src) 98.1 F (36.7 C) (Oral)  Resp 18  SpO2 100%     Fayrene Helper, PA-C 11/21/13 1659

## 2013-11-21 NOTE — Discharge Instructions (Signed)
Medication Refill, Emergency Department  We have refilled your medication today as a courtesy to you. It is best for your medical care, however, to take care of getting refills done through your primary caregiver's office. They have your records and can do a better job of follow-up than we can in the emergency department.  On maintenance medications, we often only prescribe enough medications to get you by until you are able to see your regular caregiver. This is a more expensive way to refill medications.  In the future, please plan for refills so that you will not have to use the emergency department for this.  Thank you for your help. Your help allows us to better take care of the daily emergencies that enter our department.  Document Released: 09/28/2003 Document Revised: 09/03/2011 Document Reviewed: 06/11/2005  ExitCare® Patient Information ©2014 [employer].

## 2013-11-21 NOTE — ED Notes (Signed)
Pt states that he wants a prescription of 70/30 insulin.  Has not taken it today.  States that he usually goes to the clinic at [facility] but they were closed.

## 2013-11-22 ENCOUNTER — Ambulatory Visit (HOSPITAL_COMMUNITY)
Admission: EM | Admit: 2013-11-22 | Discharge: 2013-11-22 | Discharge status: Against Medical Advice | Payer: Managed Care, Other (non HMO) | Attending: Emergency Medicine | Admitting: Emergency Medicine

## 2013-11-22 DIAGNOSIS — R1031 Right lower quadrant pain: Secondary | ICD-10-CM | POA: Insufficient documentation

## 2013-11-22 LAB — CBG MONITORING, ED: Glucose-Capillary: 87 mg/dL (ref 70–99)

## 2013-12-25 ENCOUNTER — Emergency Department (HOSPITAL_COMMUNITY)
Admission: EM | Admit: 2013-12-25 | Discharge: 2013-12-25 | Disposition: A | Discharge status: Home / Self Care | Payer: Managed Care, Other (non HMO) | Attending: Emergency Medicine | Admitting: Emergency Medicine

## 2013-12-25 DIAGNOSIS — E119 Type 2 diabetes mellitus without complications: Secondary | ICD-10-CM | POA: Insufficient documentation

## 2013-12-25 DIAGNOSIS — R1031 Right lower quadrant pain: Secondary | ICD-10-CM | POA: Insufficient documentation

## 2013-12-25 DIAGNOSIS — Z794 Long term (current) use of insulin: Secondary | ICD-10-CM | POA: Insufficient documentation

## 2013-12-25 DIAGNOSIS — F17200 Nicotine dependence, unspecified, uncomplicated: Secondary | ICD-10-CM | POA: Insufficient documentation

## 2013-12-25 DIAGNOSIS — R109 Unspecified abdominal pain: Secondary | ICD-10-CM

## 2013-12-25 DIAGNOSIS — Z79899 Other long term (current) drug therapy: Secondary | ICD-10-CM | POA: Insufficient documentation

## 2013-12-25 LAB — CBC
HCT: 44.3 % (ref 39.0–52.0)
Hemoglobin: 16.5 g/dL (ref 13.0–17.0)
MCH: 31.3 pg (ref 26.0–34.0)
MCHC: 37.2 g/dL — ABNORMAL HIGH (ref 30.0–36.0)
MCV: 84.1 fL (ref 78.0–100.0)
PLATELETS: 311 10*3/uL (ref 150–400)
RBC: 5.27 MIL/uL (ref 4.22–5.81)
RDW: 11.6 % (ref 11.5–15.5)
WBC: 6.9 10*3/uL (ref 4.0–10.5)

## 2013-12-25 LAB — COMPREHENSIVE METABOLIC PANEL
ALBUMIN: 4.6 g/dL (ref 3.5–5.2)
ALK PHOS: 103 U/L (ref 39–117)
ALT: 18 U/L (ref 0–53)
AST: 21 U/L (ref 0–37)
Anion gap: 12 (ref 5–15)
BUN: 11 mg/dL (ref 6–23)
CHLORIDE: 98 meq/L (ref 96–112)
CO2: 24 meq/L (ref 19–32)
CREATININE: 0.78 mg/dL (ref 0.50–1.35)
Calcium: 9.4 mg/dL (ref 8.4–10.5)
GFR calc Af Amer: >90 mL/min (ref >90)
GFR calc non Af Amer: >90 mL/min (ref >90)
Glucose, Bld: 284 mg/dL — ABNORMAL HIGH (ref 70–99)
POTASSIUM: 4.2 meq/L (ref 3.7–5.3)
SODIUM: 134 meq/L — AB (ref 137–147)
Total Bilirubin: 0.9 mg/dL (ref 0.3–1.2)
Total Protein: 7.9 g/dL (ref 6.0–8.3)

## 2013-12-25 LAB — URINE MICROSCOPIC-ADD ON

## 2013-12-25 LAB — URINALYSIS, ROUTINE W REFLEX MICROSCOPIC
Bilirubin Urine: NEGATIVE
Glucose, UA: >1000 mg/dL — AB
Hgb urine dipstick: NEGATIVE
Ketones, ur: 15 mg/dL — AB
LEUKOCYTES UA: NEGATIVE
NITRITE: NEGATIVE
PROTEIN: NEGATIVE mg/dL
Specific Gravity, Urine: 1.009 (ref 1.005–1.030)
UROBILINOGEN UA: 0.2 mg/dL (ref 0.0–1.0)
pH: 6.5 (ref 5.0–8.0)

## 2013-12-25 LAB — CBG MONITORING, ED: GLUCOSE-CAPILLARY: 270 mg/dL — AB (ref 70–99)

## 2013-12-25 MED ORDER — NAPROXEN 250 MG PO TABS
500.0000 mg | ORAL_TABLET | Freq: Once | ORAL | Status: AC
  Administered 2013-12-25: 500 mg via ORAL
  Filled 2013-12-25: qty 2

## 2013-12-25 MED ORDER — TRAMADOL HCL 50 MG PO TABS: 50.0000 mg | ORAL_TABLET | Freq: Four times a day (QID) | ORAL | Status: DC | PRN

## 2013-12-25 MED ORDER — NAPROXEN 500 MG PO TABS: 500.0000 mg | ORAL_TABLET | Freq: Two times a day (BID) | ORAL | Status: DC

## 2013-12-25 NOTE — ED Notes (Signed)
Pt reports: Pt reports R sided flank pain x1 month, reports pain is the worst it ever has been today. Ax4, NAD. Denies blood in his urine or difficulty urinating.

## 2013-12-25 NOTE — ED Provider Notes (Signed)
CSN: [id number]     Arrival date & time 12/25/13  1504 History   First MD Initiated Contact with Patient 12/25/13 1714     Chief Complaint  Patient presents with  . Flank Pain     (Consider location/radiation/quality/duration/timing/severity/associated sxs/prior Treatment) Patient is a 23 y.o. male presenting with flank pain. The history is provided by the patient.  Flank Pain  He is complaining of pain in the right flank which started this morning. Pain is dull and he rates it at 7/10. Nothing makes it better nothing makes it worse. There is no associated nausea or vomiting. He denies any fever or chills. He has noted that he is urinating more than normal but thinks his blood sugar may be elevated. He denies any recent, or heavy lifting. He is concerned about his kidney because she states that he drinks heavily and is concerned about the interaction of his drinking and his diabetes. He also has a cigarette smoker. He has not taken any medication to treat his pain.  Past Medical History  Diagnosis Date  . Diabetes mellitus without complication    No past surgical history on file. Family History  Problem Relation Age of Onset  . Diabetes Other   . Diabetes Sister    History  Substance Use Topics  . Smoking status: Current Every Day Smoker    Types: Cigarettes  . Smokeless tobacco: Not on file  . Alcohol Use: Yes     Comment: occ    Review of Systems  Genitourinary: Positive for flank pain.  All other systems reviewed and are negative.     Allergies  Review of patient's allergies indicates no known allergies.  Home Medications   Prior to Admission medications   Medication Sig Start Date End Date Taking? Authorizing Provider  guaiFENesin (ROBITUSSIN) 100 MG/5ML liquid Take 5-10 mLs (100-200 mg total) by mouth every 4 (four) hours as needed for cough. 10/24/13  Yes Jennifer L Piepenbrink, PA-C  insulin NPH-regular Human (NOVOLIN 70/30) (70-30) 100 UNIT/ML injection Inject  20-30 Units into the skin 2 (two) times daily with a meal. 30 units every morning and 20 units at night 11/21/13  Yes Fayrene HelperBowie Tran, PA-C  MINOXIDIL, TOPICAL, (MINOXIDIL FOR MEN) 5 % SOLN Apply 1 application topically daily.   Yes Historical Provider, MD  Multiple Vitamins-Minerals (MULTIVITAMIN WITH MINERALS) tablet Take 1 tablet by mouth daily.   Yes Historical Provider, MD  Pyridoxine HCl (VITAMIN B-6) 250 MG tablet Take 250 mg by mouth daily.   Yes Historical Provider, MD   BP 135/75  Pulse 88  Temp(Src) 98.3 F (36.8 C) (Oral)  Resp 17  Wt 154 lb 5.2 oz (70 kg)  SpO2 99% Physical Exam  Nursing note and vitals reviewed.  23 year old male, resting comfortably and in no acute distress. Vital signs are normal. Oxygen saturation is 99%, which is normal. Head is normocephalic and atraumatic. PERRLA, EOMI. Oropharynx is clear. Neck is nontender and supple without adenopathy or JVD. Back is nontender and there is no CVA tenderness. Lungs are clear without rales, wheezes, or rhonchi. Chest is nontender. Heart has regular rate and rhythm without murmur. Abdomen is soft, flat, nontender without masses or hepatosplenomegaly and peristalsis is normoactive. Extremities have no cyanosis or edema, full range of motion is present. Skin is warm and dry without rash. Neurologic: Mental status is normal, cranial nerves are intact, there are no motor or sensory deficits.  ED Course  Procedures (including critical care time) Labs Review ,  Results for orders placed during the hospital encounter of 12/25/13  URINALYSIS, ROUTINE W REFLEX MICROSCOPIC      Result Value Ref Range   Color, Urine YELLOW  YELLOW   APPearance CLEAR  CLEAR   Specific Gravity, Urine 1.009  1.005 - 1.030   pH 6.5  5.0 - 8.0   Glucose, UA >1000 (*) NEGATIVE mg/dL   Hgb urine dipstick NEGATIVE  NEGATIVE   Bilirubin Urine NEGATIVE  NEGATIVE   Ketones, ur 15 (*) NEGATIVE mg/dL   Protein, ur NEGATIVE  NEGATIVE mg/dL    Urobilinogen, UA 0.2  0.0 - 1.0 mg/dL   Nitrite NEGATIVE  NEGATIVE   Leukocytes, UA NEGATIVE  NEGATIVE  CBC      Result Value Ref Range   WBC 6.9  4.0 - 10.5 K/uL   RBC 5.27  4.22 - 5.81 MIL/uL   Hemoglobin 16.5  13.0 - 17.0 g/dL   HCT 96.044.3  45.439.0 - 09.852.0 %   MCV 84.1  78.0 - 100.0 fL   MCH 31.3  26.0 - 34.0 pg   MCHC 37.2 (*) 30.0 - 36.0 g/dL   RDW 11.911.6  14.711.5 - 82.915.5 %   Platelets 311  150 - 400 K/uL  COMPREHENSIVE METABOLIC PANEL      Result Value Ref Range   Sodium 134 (*) 137 - 147 mEq/L   Potassium 4.2  3.7 - 5.3 mEq/L   Chloride 98  96 - 112 mEq/L   CO2 24  19 - 32 mEq/L   Glucose, Bld 284 (*) 70 - 99 mg/dL   BUN 11  6 - 23 mg/dL   Creatinine, Ser 5.620.78  0.50 - 1.35 mg/dL   Calcium 9.4  8.4 - 13.010.5 mg/dL   Total Protein 7.9  6.0 - 8.3 g/dL   Albumin 4.6  3.5 - 5.2 g/dL   AST 21  0 - 37 U/L   ALT 18  0 - 53 U/L   Alkaline Phosphatase 103  39 - 117 U/L   Total Bilirubin 0.9  0.3 - 1.2 mg/dL   GFR calc non Af Amer >90  >90 mL/min   GFR calc Af Amer >90  >90 mL/min   Anion gap 12  5 - 15  URINE MICROSCOPIC-ADD ON      Result Value Ref Range   Squamous Epithelial / LPF RARE  RARE   Bacteria, UA RARE  RARE  CBG MONITORING, ED      Result Value Ref Range   Glucose-Capillary 270 (*) 70 - 99 mg/dL   Comment 1 Documented in Chart     Comment 2 Notify RN      MDM   Final diagnoses:  Right flank pain    Right flank pain of uncertain cause. No evidence of UTI, urolithiasis. Most likely, pain is musculoskeletal. He will be given prescriptions for naproxen and tramadol. He is encouraged to stop drinking and stop smoking.    Dione Boozeavid Zenith Kercheval, MD 12/25/13 [phone]

## 2013-12-25 NOTE — Discharge Instructions (Signed)
Flank Pain Flank pain refers to pain that is located on the side of the body between the upper abdomen and the back. The pain may occur over a short period of time (acute) or may be long-term or reoccurring (chronic). It may be mild or severe. Flank pain can be caused by many things. CAUSES  Some of the more common causes of flank pain include:  Muscle strains.   Muscle spasms.   A disease of your spine (vertebral disk disease).   A lung infection (pneumonia).   Fluid around your lungs (pulmonary edema).   A kidney infection.   Kidney stones.   A very painful skin rash caused by the chickenpox virus (shingles).   Gallbladder disease.  HOME CARE INSTRUCTIONS  Home care will depend on the cause of your pain. In general,  Rest as directed by your caregiver.  Drink enough fluids to keep your urine clear or pale yellow.  Only take over-the-counter or prescription medicines as directed by your caregiver. Some medicines may help relieve the pain.  Tell your caregiver about any changes in your pain.  Follow up with your caregiver as directed. SEEK IMMEDIATE MEDICAL CARE IF:   Your pain is not controlled with medicine.   You have new or worsening symptoms.  Your pain increases.   You have abdominal pain.   You have shortness of breath.   You have persistent nausea or vomiting.   You have swelling in your abdomen.   You feel faint or pass out.   You have blood in your urine.  You have a fever or persistent symptoms for more than 2-3 days.  You have a fever and your symptoms suddenly get worse. MAKE SURE YOU:   Understand these instructions.  Will watch your condition.  Will get help right away if you are not doing well or get worse. Document Released: 08/02/2005 Document Revised: 03/05/2012 Document Reviewed: 01/24/2012 [facility] Patient Information 2015 [place], [place]. This information is not intended to replace advice given to you by your  health care provider. Make sure you discuss any questions you have with your health care provider.  Smoking Hazards Smoking cigarettes is extremely bad for your health. Tobacco smoke has over 200 known poisons in it. It contains the poisonous gases nitrogen oxide and carbon monoxide. There are over 60 chemicals in tobacco smoke that cause cancer. Some of the chemicals found in cigarette smoke include:   Cyanide.   Benzene.   Formaldehyde.   Methanol (wood alcohol).   Acetylene (fuel used in welding torches).   Ammonia.  Even smoking lightly shortens your life expectancy by several years. You can greatly reduce the risk of medical problems for you and your family by stopping now. Smoking is the most preventable cause of death and disease in our society. Within days of quitting smoking, your circulation improves, you decrease the risk of having a heart attack, and your lung capacity improves. There may be some increased phlegm in the first few days after quitting, and it may take months for your lungs to clear up completely. Quitting for 10 years reduces your risk of developing lung cancer to almost that of a nonsmoker.  WHAT ARE THE RISKS OF SMOKING? Cigarette smokers have an increased risk of many serious medical problems, including:  Lung cancer.   Lung disease (such as pneumonia, bronchitis, and emphysema).   Heart attack and chest pain due to the heart not getting enough oxygen (angina).   Heart disease and peripheral blood  vessel disease.   Hypertension.   Stroke.   Oral cancer (cancer of the lip, mouth, or voice box).   Bladder cancer.   Pancreatic cancer.   Cervical cancer.   Pregnancy complications, including premature birth.   Stillbirths and smaller newborn babies, birth defects, and genetic damage to sperm.   Early menopause.   Lower estrogen level for women.   Infertility.   Facial wrinkles.   Blindness.   Increased risk of  broken bones (fractures).   Senile dementia.   Stomach ulcers and internal bleeding.   Delayed wound healing and increased risk of complications during surgery. Because of secondhand smoke exposure, children of smokers have an increased risk of the following:   Sudden infant death syndrome (SIDS).   Respiratory infections.   Lung cancer.   Heart disease.   Ear infections.  WHY IS SMOKING ADDICTIVE? Nicotine is the chemical agent in tobacco that is capable of causing addiction or dependence. When you smoke and inhale, nicotine is absorbed rapidly into the bloodstream through your lungs. Both inhaled and noninhaled nicotine may be addictive.  WHAT ARE THE BENEFITS OF QUITTING?  There are many health benefits to quitting smoking. Some are:   The likelihood of developing cancer and heart disease decreases. Health improvements are seen almost immediately.   Blood pressure, pulse rate, and breathing patterns start returning to normal soon after quitting.   People who quit may see an improvement in their overall quality of life.  HOW DO YOU QUIT SMOKING? Smoking is an addiction with both physical and psychological effects, and longtime habits can be hard to change. Your health care provider can recommend:  Programs and community resources, which may include group support, education, or therapy.  Replacement products, such as patches, gum, and nasal sprays. Use these products only as directed. Do not replace cigarette smoking with electronic cigarettes (commonly called e-cigarettes). The safety of e-cigarettes is unknown, and some may contain harmful chemicals. FOR MORE INFORMATION  American Lung Association: www.lung.org  American Cancer Society: www.cancer.org Document Released: 07/19/2004 Document Revised: 04/01/2013 Document Reviewed: 12/01/2012 [facility] Patient Information 2015 [place], [place]. This information is not intended to replace advice given to you by your  health care provider. Make sure you discuss any questions you have with your health care provider.  Alcohol Problems Most adults who drink alcohol drink in moderation (not a lot) are at low risk for developing problems related to their drinking. However, all drinkers, including low-risk drinkers, should know about the health risks connected with drinking alcohol. RECOMMENDATIONS FOR LOW-RISK DRINKING  Drink in moderation. Moderate drinking is defined as follows:   Men - no more than 2 drinks per day.  Nonpregnant women - no more than 1 drink per day.  Over age 71 - no more than 1 drink per day. A standard drink is 12 grams of pure alcohol, which is equal to a 12 ounce bottle of beer or wine cooler, a 5 ounce glass of wine, or 1.5 ounces of distilled spirits (such as whiskey, brandy, vodka, or rum).  ABSTAIN FROM (DO NOT DRINK) ALCOHOL:  When pregnant or considering pregnancy.  When taking a medication that interacts with alcohol.  If you are alcohol dependent.  A medical condition that prohibits drinking alcohol (such as ulcer, liver disease, or heart disease). DISCUSS WITH YOUR CAREGIVER:  If you are at risk for coronary heart disease, discuss the potential benefits and risks of alcohol use: Light to moderate drinking is associated with lower rates of  coronary heart disease in certain populations (for example, men over age 29 and postmenopausal women). Infrequent or nondrinkers are advised not to begin light to moderate drinking to reduce the risk of coronary heart disease so as to avoid creating an alcohol-related problem. Similar protective effects can likely be gained through proper diet and exercise.  Women and the elderly have smaller amounts of body water than men. As a result women and the elderly achieve a higher blood alcohol concentration after drinking the same amount of alcohol.  Exposing a fetus to alcohol can cause a broad range of birth defects referred to as Fetal Alcohol  Syndrome (FAS) or Alcohol-Related Birth Defects (ARBD). Although FAS/ARBD is connected with excessive alcohol consumption during pregnancy, studies also have reported neurobehavioral problems in infants born to mothers reporting drinking an average of 1 drink per day during pregnancy.  Heavier drinking (the consumption of more than 4 drinks per occasion by men and more than 3 drinks per occasion by women) impairs learning (cognitive) and psychomotor functions and increases the risk of alcohol-related problems, including accidents and injuries. CAGE QUESTIONS:   Have you ever felt that you should Cut down on your drinking?  Have people Annoyed you by criticizing your drinking?  Have you ever felt bad or Guilty about your drinking?  Have you ever had a drink first thing in the morning to steady your nerves or get rid of a hangover (Eye opener)? If you answered positively to any of these questions: You may be at risk for alcohol-related problems if alcohol consumption is:   Men: Greater than 14 drinks per week or more than 4 drinks per occasion.  Women: Greater than 7 drinks per week or more than 3 drinks per occasion. Do you or your family have a medical history of alcohol-related problems, such as:  Blackouts.  Sexual dysfunction.  Depression.  Trauma.  Liver dysfunction.  Sleep disorders.  Hypertension.  Chronic abdominal pain.  Has your drinking ever caused you problems, such as problems with your family, problems with your work (or school) performance, or accidents/injuries?  Do you have a compulsion to drink or a preoccupation with drinking?  Do you have poor control or are you unable to stop drinking once you have started?  Do you have to drink to avoid withdrawal symptoms?  Do you have problems with withdrawal such as tremors, nausea, sweats, or mood disturbances?  Does it take more alcohol than in the past to get you high?  Do you feel a strong urge to  drink?  Do you change your plans so that you can have a drink?  Do you ever drink in the morning to relieve the shakes or a hangover? If you have answered a number of the previous questions positively, it may be time for you to talk to your caregivers, family, and friends and see if they think you have a problem. Alcoholism is a chemical dependency that keeps getting worse and will eventually destroy your health and relationships. Many alcoholics end up dead, impoverished, or in prison. This is often the end result of all chemical dependency.  Do not be discouraged if you are not ready to take action immediately.  Decisions to change behavior often involve up and down desires to change and feeling like you cannot decide.  Try to think more seriously about your drinking behavior.  Think of the reasons to quit. WHERE TO GO FOR ADDITIONAL INFORMATION   The National Institute on Alcohol Abuse and Alcoholism (  NIAAA) BasicStudents.dk  [employer] on Alcoholism and Drug Dependence (NCADD) www.ncadd.org  American Society of Addiction Medicine (ASAM) RoyalDiary.gl  Document Released: 06/11/2005 Document Revised: 09/03/2011 Document Reviewed: 01/28/2008 [facility] Patient Information 2015 [place], [place]. This information is not intended to replace advice given to you by your health care provider. Make sure you discuss any questions you have with your health care provider.  Naproxen and naproxen sodium oral immediate-release tablets What is this medicine? NAPROXEN (na PROX en) is a non-steroidal anti-inflammatory drug (NSAID). It is used to reduce swelling and to treat pain. This medicine may be used for dental pain, headache, or painful monthly periods. It is also used for painful joint and muscular problems such as arthritis, tendinitis, bursitis, and gout. This medicine may be used for other purposes; ask your health care provider or pharmacist if you have questions. COMMON BRAND NAME(S):  Aflaxen, Aleve, Aleve Arthritis, All Day Relief, Anaprox, Anaprox DS, Naprosyn What should I tell my health care provider before I take this medicine? They need to know if you have any of these conditions: -asthma -cigarette smoker -drink more than 3 alcohol containing drinks a day -heart disease or circulation problems such as heart failure or leg edema (fluid retention) -high blood pressure -kidney disease -liver disease -stomach bleeding or ulcers -an unusual or allergic reaction to naproxen, aspirin, other NSAIDs, other medicines, foods, dyes, or preservatives -pregnant or trying to get pregnant -breast-feeding How should I use this medicine? Take this medicine by mouth with a glass of water. Follow the directions on the prescription label. Take it with food if your stomach gets upset. Try to not lie down for at least 10 minutes after you take it. Take your medicine at regular intervals. Do not take your medicine more often than directed. Long-term, continuous use may increase the risk of heart attack or stroke. A special MedGuide will be given to you by the pharmacist with each prescription and refill. Be sure to read this information carefully each time. Talk to your pediatrician regarding the use of this medicine in children. Special care may be needed. Overdosage: If you think you have taken too much of this medicine contact a poison control center or emergency room at once. NOTE: This medicine is only for you. Do not share this medicine with others. What if I miss a dose? If you miss a dose, take it as soon as you can. If it is almost time for your next dose, take only that dose. Do not take double or extra doses. What may interact with this medicine? -alcohol -aspirin -cidofovir -diuretics -lithium -methotrexate -other drugs for inflammation like ketorolac or prednisone -pemetrexed -probenecid -warfarin This list may not describe all possible interactions. Give your health  care provider a list of all the medicines, herbs, non-prescription drugs, or dietary supplements you use. Also tell them if you smoke, drink alcohol, or use illegal drugs. Some items may interact with your medicine. What should I watch for while using this medicine? Tell your doctor or health care professional if your pain does not get better. Talk to your doctor before taking another medicine for pain. Do not treat yourself. This medicine does not prevent heart attack or stroke. In fact, this medicine may increase the chance of a heart attack or stroke. The chance may increase with longer use of this medicine and in people who have heart disease. If you take aspirin to prevent heart attack or stroke, talk with your doctor or health care professional. Do  not take other medicines that contain aspirin, ibuprofen, or naproxen with this medicine. Side effects such as stomach upset, nausea, or ulcers may be more likely to occur. Many medicines available without a prescription should not be taken with this medicine. This medicine can cause ulcers and bleeding in the stomach and intestines at any time during treatment. Do not smoke cigarettes or drink alcohol. These increase irritation to your stomach and can make it more susceptible to damage from this medicine. Ulcers and bleeding can happen without warning symptoms and can cause death. You may get drowsy or dizzy. Do not drive, use machinery, or do anything that needs mental alertness until you know how this medicine affects you. Do not stand or sit up quickly, especially if you are an older patient. This reduces the risk of dizzy or fainting spells. This medicine can cause you to bleed more easily. Try to avoid damage to your teeth and gums when you brush or floss your teeth. What side effects may I notice from receiving this medicine? Side effects that you should report to your doctor or health care professional as soon as possible: -black or bloody stools,  blood in the urine or vomit -blurred vision -chest pain -difficulty breathing or wheezing -nausea or vomiting -severe stomach pain -skin rash, skin redness, blistering or peeling skin, hives, or itching -slurred speech or weakness on one side of the body -swelling of eyelids, throat, lips -unexplained weight gain or swelling -unusually weak or tired -yellowing of eyes or skin Side effects that usually do not require medical attention (report to your doctor or health care professional if they continue or are bothersome): -constipation -headache -heartburn This list may not describe all possible side effects. Call your doctor for medical advice about side effects. You may report side effects to FDA at 1-800-FDA-1088. Where should I keep my medicine? Keep out of the reach of children. Store at room temperature between 15 and 30 degrees C (59 and 86 degrees F). Keep container tightly closed. Throw away any unused medicine after the expiration date. NOTE: This sheet is a summary. It may not cover all possible information. If you have questions about this medicine, talk to your doctor, pharmacist, or health care provider.  2015, Elsevier/Gold Standard. (2009-06-13 20:10:16)  Tramadol tablets What is this medicine? TRAMADOL (TRA ma dole) is a pain reliever. It is used to treat moderate to severe pain in adults. This medicine may be used for other purposes; ask your health care provider or pharmacist if you have questions. COMMON BRAND NAME(S): Ultram What should I tell my health care provider before I take this medicine? They need to know if you have any of these conditions: -brain tumor -depression -drug abuse or addiction -head injury -if you frequently drink alcohol containing drinks -kidney disease or trouble passing urine -liver disease -lung disease, asthma, or breathing problems -seizures or epilepsy -suicidal thoughts, plans, or attempt; a previous suicide attempt by you or a  family member -an unusual or allergic reaction to tramadol, codeine, other medicines, foods, dyes, or preservatives -pregnant or trying to get pregnant -breast-feeding How should I use this medicine? Take this medicine by mouth with a full glass of water. Follow the directions on the prescription label. If the medicine upsets your stomach, take it with food or milk. Do not take more medicine than you are told to take. Talk to your pediatrician regarding the use of this medicine in children. Special care may be needed. Overdosage: If you think  you have taken too much of this medicine contact a poison control center or emergency room at once. NOTE: This medicine is only for you. Do not share this medicine with others. What if I miss a dose? If you miss a dose, take it as soon as you can. If it is almost time for your next dose, take only that dose. Do not take double or extra doses. What may interact with this medicine? Do not take this medicine with any of the following medications: -MAOIs like Carbex, Eldepryl, Marplan, Nardil, and Parnate This medicine may also interact with the following medications: -alcohol or medicines that contain alcohol -antihistamines -benzodiazepines -bupropion -carbamazepine or oxcarbazepine -clozapine -cyclobenzaprine -digoxin -furazolidone -linezolid -medicines for depression, anxiety, or psychotic disturbances -medicines for migraine headache like almotriptan, eletriptan, frovatriptan, naratriptan, rizatriptan, sumatriptan, zolmitriptan -medicines for pain like pentazocine, buprenorphine, butorphanol, meperidine, nalbuphine, and propoxyphene -medicines for sleep -muscle relaxants -naltrexone -phenobarbital -phenothiazines like perphenazine, thioridazine, chlorpromazine, mesoridazine, fluphenazine, prochlorperazine, promazine, and trifluoperazine -procarbazine -warfarin This list may not describe all possible interactions. Give your health care  provider a list of all the medicines, herbs, non-prescription drugs, or dietary supplements you use. Also tell them if you smoke, drink alcohol, or use illegal drugs. Some items may interact with your medicine. What should I watch for while using this medicine? Tell your doctor or health care professional if your pain does not go away, if it gets worse, or if you have new or a different type of pain. You may develop tolerance to the medicine. Tolerance means that you will need a higher dose of the medicine for pain relief. Tolerance is normal and is expected if you take this medicine for a long time. Do not suddenly stop taking your medicine because you may develop a severe reaction. Your body becomes used to the medicine. This does NOT mean you are addicted. Addiction is a behavior related to getting and using a drug for a non-medical reason. If you have pain, you have a medical reason to take pain medicine. Your doctor will tell you how much medicine to take. If your doctor wants you to stop the medicine, the dose will be slowly lowered over time to avoid any side effects. You may get drowsy or dizzy. Do not drive, use machinery, or do anything that needs mental alertness until you know how this medicine affects you. Do not stand or sit up quickly, especially if you are an older patient. This reduces the risk of dizzy or fainting spells. Alcohol can increase or decrease the effects of this medicine. Avoid alcoholic drinks. You may have constipation. Try to have a bowel movement at least every 2 to 3 days. If you do not have a bowel movement for 3 days, call your doctor or health care professional. Your mouth may get dry. Chewing sugarless gum or sucking hard candy, and drinking plenty of water may help. Contact your doctor if the problem does not go away or is severe. What side effects may I notice from receiving this medicine? Side effects that you should report to your doctor or health care professional  as soon as possible: -allergic reactions like skin rash, itching or hives, swelling of the face, lips, or tongue -breathing difficulties, wheezing -confusion -itching -light headedness or fainting spells -redness, blistering, peeling or loosening of the skin, including inside the mouth -seizures Side effects that usually do not require medical attention (report to your doctor or health care professional if they continue or are bothersome): -constipation -  dizziness -drowsiness -headache -nausea, vomiting This list may not describe all possible side effects. Call your doctor for medical advice about side effects. You may report side effects to FDA at 1-800-FDA-1088. Where should I keep my medicine? Keep out of the reach of children. Store at room temperature between 15 and 30 degrees C (59 and 86 degrees F). Keep container tightly closed. Throw away any unused medicine after the expiration date. NOTE: This sheet is a summary. It may not cover all possible information. If you have questions about this medicine, talk to your doctor, pharmacist, or health care provider.  2015, Elsevier/Gold Standard. (2010-02-22 11:55:44)

## 2013-12-29 ENCOUNTER — Emergency Department (HOSPITAL_COMMUNITY)
Admission: EM | Admit: 2013-12-29 | Discharge: 2013-12-29 | Disposition: A | Discharge status: Home / Self Care | Payer: Managed Care, Other (non HMO) | Attending: Emergency Medicine | Admitting: Emergency Medicine

## 2013-12-29 ENCOUNTER — Encounter (HOSPITAL_COMMUNITY): Payer: Self-pay | Admitting: Emergency Medicine

## 2013-12-29 DIAGNOSIS — Z794 Long term (current) use of insulin: Secondary | ICD-10-CM | POA: Insufficient documentation

## 2013-12-29 DIAGNOSIS — F17200 Nicotine dependence, unspecified, uncomplicated: Secondary | ICD-10-CM | POA: Insufficient documentation

## 2013-12-29 DIAGNOSIS — E1110 Type 2 diabetes mellitus with ketoacidosis without coma: Secondary | ICD-10-CM | POA: Insufficient documentation

## 2013-12-29 DIAGNOSIS — R739 Hyperglycemia, unspecified: Secondary | ICD-10-CM

## 2013-12-29 DIAGNOSIS — Z791 Long term (current) use of non-steroidal anti-inflammatories (NSAID): Secondary | ICD-10-CM | POA: Insufficient documentation

## 2013-12-29 DIAGNOSIS — Z79899 Other long term (current) drug therapy: Secondary | ICD-10-CM | POA: Insufficient documentation

## 2013-12-29 LAB — URINE MICROSCOPIC-ADD ON

## 2013-12-29 LAB — COMPREHENSIVE METABOLIC PANEL
ALBUMIN: 4.5 g/dL (ref 3.5–5.2)
ALT: 14 U/L (ref 0–53)
AST: 18 U/L (ref 0–37)
Alkaline Phosphatase: 109 U/L (ref 39–117)
Anion gap: 14 (ref 5–15)
BUN: 16 mg/dL (ref 6–23)
CALCIUM: 9.3 mg/dL (ref 8.4–10.5)
CO2: 21 mEq/L (ref 19–32)
CREATININE: 0.85 mg/dL (ref 0.50–1.35)
Chloride: 99 mEq/L (ref 96–112)
GFR calc Af Amer: >90 mL/min (ref >90)
GFR calc non Af Amer: >90 mL/min (ref >90)
Glucose, Bld: 366 mg/dL — ABNORMAL HIGH (ref 70–99)
Potassium: 4.8 mEq/L (ref 3.7–5.3)
SODIUM: 134 meq/L — AB (ref 137–147)
Total Bilirubin: 0.5 mg/dL (ref 0.3–1.2)
Total Protein: 7.7 g/dL (ref 6.0–8.3)

## 2013-12-29 LAB — URINALYSIS, ROUTINE W REFLEX MICROSCOPIC
Bilirubin Urine: NEGATIVE
HGB URINE DIPSTICK: NEGATIVE
Ketones, ur: NEGATIVE mg/dL
LEUKOCYTES UA: NEGATIVE
Nitrite: NEGATIVE
Protein, ur: NEGATIVE mg/dL
Specific Gravity, Urine: 1.04 — ABNORMAL HIGH (ref 1.005–1.030)
Urobilinogen, UA: 0.2 mg/dL (ref 0.0–1.0)
pH: 5.5 (ref 5.0–8.0)

## 2013-12-29 LAB — CBC
HCT: 44.7 % (ref 39.0–52.0)
Hemoglobin: 16.2 g/dL (ref 13.0–17.0)
MCH: 30.6 pg (ref 26.0–34.0)
MCHC: 36.2 g/dL — ABNORMAL HIGH (ref 30.0–36.0)
MCV: 84.5 fL (ref 78.0–100.0)
Platelets: 316 10*3/uL (ref 150–400)
RBC: 5.29 MIL/uL (ref 4.22–5.81)
RDW: 11.5 % (ref 11.5–15.5)
WBC: 6.3 10*3/uL (ref 4.0–10.5)

## 2013-12-29 LAB — CBG MONITORING, ED
GLUCOSE-CAPILLARY: 297 mg/dL — AB (ref 70–99)
GLUCOSE-CAPILLARY: 291 mg/dL — AB (ref 70–99)
Glucose-Capillary: 376 mg/dL — ABNORMAL HIGH (ref 70–99)

## 2013-12-29 LAB — KETONES, QUALITATIVE: ACETONE BLD: NEGATIVE

## 2013-12-29 MED ORDER — INSULIN ASPART 100 UNIT/ML ~~LOC~~ SOLN
10.0000 [IU] | Freq: Once | SUBCUTANEOUS | Status: AC
  Administered 2013-12-29: 10 [IU] via SUBCUTANEOUS
  Filled 2013-12-29: qty 1

## 2013-12-29 MED ORDER — SODIUM CHLORIDE 0.9 % IV BOLUS (SEPSIS)
1000.0000 mL | Freq: Once | INTRAVENOUS | Status: AC
  Administered 2013-12-29: 1000 mL via INTRAVENOUS

## 2013-12-29 MED ORDER — INSULIN NPH ISOPHANE & REGULAR (70-30) 100 UNIT/ML ~~LOC~~ SUSP
30.0000 [IU] | Freq: Every day | SUBCUTANEOUS | Status: DC
Start: 2013-12-29 — End: 2015-05-12

## 2013-12-29 MED ORDER — INSULIN NPH ISOPHANE & REGULAR (70-30) 100 UNIT/ML ~~LOC~~ SUSP: 20.0000 [IU] | Freq: Every day | SUBCUTANEOUS | Status: DC

## 2013-12-29 NOTE — Discharge Instructions (Signed)

## 2013-12-29 NOTE — ED Notes (Signed)
Pt states that he needs prescription for insulin. Pt ran out yesterday and [facility] is [facility].

## 2013-12-29 NOTE — ED Provider Notes (Signed)
TIME SEEN: 12:10 PM   CHIEF COMPLAINT: Hyperglycemia  HPI: Patient is a 23 y.o. M with history of insulin-dependent diabetes with prior history of DKA who presents the emergency department with hyperglycemia. He states he ran out of his Humulin 70/30 yesterday. He denies that he is having other symptoms. No headache, fever, vomiting or diarrhea, abdominal pain, dysuria or hematuria.  ROS: See HPI Constitutional: no fever  Eyes: no drainage  ENT: no runny nose   Cardiovascular:  no chest pain  Resp: no SOB  GI: no vomiting GU: no dysuria Integumentary: no rash  Allergy: no hives  Musculoskeletal: no leg swelling  Neurological: no slurred speech ROS otherwise negative  PAST MEDICAL HISTORY/PAST SURGICAL HISTORY:  Past Medical History  Diagnosis Date  . Diabetes mellitus without complication     MEDICATIONS:  Prior to Admission medications   Medication Sig Start Date End Date Taking? Authorizing Provider  insulin NPH-regular Human (NOVOLIN 70/30) (70-30) 100 UNIT/ML injection Inject 20-30 Units into the skin 2 (two) times daily with a meal. 30 units every morning and 20 units at night 11/21/13   Fayrene HelperBowie Tran, PA-C  MINOXIDIL, TOPICAL, (MINOXIDIL FOR MEN) 5 % SOLN Apply 1 application topically daily.    Historical Provider, MD  Multiple Vitamins-Minerals (MULTIVITAMIN WITH MINERALS) tablet Take 1 tablet by mouth daily.    Historical Provider, MD  naproxen (NAPROSYN) 500 MG tablet Take 1 tablet (500 mg total) by mouth 2 (two) times daily. 12/25/13   Dione Boozeavid Glick, MD  Pyridoxine HCl (VITAMIN B-6) 250 MG tablet Take 250 mg by mouth daily.    Historical Provider, MD    ALLERGIES:  No Known Allergies  SOCIAL HISTORY:  History  Substance Use Topics  . Smoking status: Current Every Day Smoker    Types: Cigarettes  . Smokeless tobacco: Not on file  . Alcohol Use: Yes     Comment: occ    FAMILY HISTORY: Family History  Problem Relation Age of Onset  . Diabetes Other   . Diabetes  Sister     EXAM: BP 125/69  Pulse 80  Temp(Src) 98 F (36.7 C) (Oral)  Resp 17  SpO2 98% CONSTITUTIONAL: Alert and oriented and responds appropriately to questions. Well-appearing; well-nourished, well-hydrated, in no apparent distress HEAD: Normocephalic EYES: Conjunctivae clear, PERRL ENT: normal nose; no rhinorrhea; moist mucous membranes; pharynx without lesions noted NECK: Supple, no meningismus, no LAD  CARD: RRR; S1 and S2 appreciated; no murmurs, no clicks, no rubs, no gallops RESP: Normal chest excursion without splinting or tachypnea; breath sounds clear and equal bilaterally; no wheezes, no rhonchi, no rales,  ABD/GI: Normal bowel sounds; non-distended; soft, non-tender, no rebound, no guarding BACK:  The back appears normal and is non-tender to palpation, there is no CVA tenderness EXT: Normal ROM in all joints; non-tender to palpation; no edema; normal capillary refill; no cyanosis    SKIN: Normal color for age and race; warm NEURO: Moves all extremities equally PSYCH: The patient's mood and manner are appropriate. Grooming and personal hygiene are appropriate.  MEDICAL DECISION MAKING: Patient here with hyperglycemia. We'll obtain labs to rule out DKA. Suspect his hyperglycemia secondary to running out of his insulin. He states [facility] but they were closed today.  ED PROGRESS: Patient's labs are unremarkable other than hyperglycemia. His anion gap is 14. His glucose has improved less than 300 with 2 L of IV fluids. We'll discharge home with prescription for  his insulin. He states he takes 30 units in the morning and 20 units at night of Humulin 70/30. Discussed supportive care instructions and return precautions. He verbalizes understanding and is comfortable with plan.    Layla MawKristen N Alonia Dibuono, DO 12/29/13 1511

## 2014-03-03 ENCOUNTER — Encounter (HOSPITAL_COMMUNITY): Payer: Self-pay | Admitting: Emergency Medicine

## 2014-03-03 ENCOUNTER — Emergency Department (HOSPITAL_COMMUNITY): Payer: Managed Care, Other (non HMO)

## 2014-03-03 DIAGNOSIS — F17200 Nicotine dependence, unspecified, uncomplicated: Secondary | ICD-10-CM | POA: Insufficient documentation

## 2014-03-03 DIAGNOSIS — Z79899 Other long term (current) drug therapy: Secondary | ICD-10-CM | POA: Diagnosis not present

## 2014-03-03 DIAGNOSIS — Z791 Long term (current) use of non-steroidal anti-inflammatories (NSAID): Secondary | ICD-10-CM | POA: Insufficient documentation

## 2014-03-03 DIAGNOSIS — E119 Type 2 diabetes mellitus without complications: Secondary | ICD-10-CM | POA: Diagnosis not present

## 2014-03-03 DIAGNOSIS — J209 Acute bronchitis, unspecified: Secondary | ICD-10-CM | POA: Insufficient documentation

## 2014-03-03 DIAGNOSIS — R079 Chest pain, unspecified: Secondary | ICD-10-CM | POA: Insufficient documentation

## 2014-03-03 DIAGNOSIS — Z794 Long term (current) use of insulin: Secondary | ICD-10-CM | POA: Diagnosis not present

## 2014-03-03 LAB — I-STAT TROPONIN, ED: Troponin i, poc: 0 ng/mL (ref 0.00–0.08)

## 2014-03-03 LAB — BASIC METABOLIC PANEL
Anion gap: 12 (ref 5–15)
BUN: 13 mg/dL (ref 6–23)
CALCIUM: 8.8 mg/dL (ref 8.4–10.5)
CO2: 25 mEq/L (ref 19–32)
Chloride: 101 mEq/L (ref 96–112)
Creatinine, Ser: 0.92 mg/dL (ref 0.50–1.35)
GFR calc Af Amer: >90 mL/min (ref >90)
GLUCOSE: 174 mg/dL — AB (ref 70–99)
Potassium: 3.9 mEq/L (ref 3.7–5.3)
Sodium: 138 mEq/L (ref 137–147)

## 2014-03-03 LAB — CBC
HEMATOCRIT: 42.5 % (ref 39.0–52.0)
HEMOGLOBIN: 15.6 g/dL (ref 13.0–17.0)
MCH: 30.9 pg (ref 26.0–34.0)
MCHC: 36.7 g/dL — ABNORMAL HIGH (ref 30.0–36.0)
MCV: 84.2 fL (ref 78.0–100.0)
Platelets: 334 10*3/uL (ref 150–400)
RBC: 5.05 MIL/uL (ref 4.22–5.81)
RDW: 11.7 % (ref 11.5–15.5)
WBC: 10.6 10*3/uL — ABNORMAL HIGH (ref 4.0–10.5)

## 2014-03-03 NOTE — ED Notes (Signed)
Pt. reports mid chest pain with mild SOB and productive cough/chest congestion  onset this morning . Denies nausea or diaphoresis .

## 2014-03-04 ENCOUNTER — Emergency Department (HOSPITAL_COMMUNITY)
Admission: EM | Admit: 2014-03-04 | Discharge: 2014-03-04 | Disposition: A | Discharge status: Home / Self Care | Payer: Managed Care, Other (non HMO) | Attending: Emergency Medicine | Admitting: Emergency Medicine

## 2014-03-04 DIAGNOSIS — J40 Bronchitis, not specified as acute or chronic: Secondary | ICD-10-CM

## 2014-03-04 MED ORDER — HYDROCOD POLST-CHLORPHEN POLST 10-8 MG/5ML PO LQCR: 5.0000 mL | Freq: Every evening | ORAL | Status: DC | PRN

## 2014-03-04 MED ORDER — AZITHROMYCIN 250 MG PO TABS: 250.0000 mg | ORAL_TABLET | Freq: Every day | ORAL | Status: DC

## 2014-03-04 NOTE — Discharge Instructions (Signed)

## 2014-03-04 NOTE — ED Provider Notes (Signed)
CSN: [id number]     Arrival date & time 03/03/14  2231 History   First MD Initiated Contact with Patient 03/04/14 0023     Chief Complaint  Patient presents with  . Chest Pain     (Consider location/radiation/quality/duration/timing/severity/associated sxs/prior Treatment) Patient is a 23 y.o. male presenting with cough. The history is provided by the patient. No language interpreter was used.  Cough Cough characteristics:  Non-productive Severity:  Moderate Onset quality:  Gradual Duration:  1 day Timing:  Constant Smoker: yes   Associated symptoms: chest pain   Associated symptoms: no fever, no myalgias, no rash, no shortness of breath, no sore throat and no wheezing   Associated symptoms comment:  Cough, chest congestion and chest tightness since earlier today. No fever. No nasal congestion or sore throat. He reports a distant history of asthma and has not required an inhaler since childhood. He continues to smoke.   Past Medical History  Diagnosis Date  . Diabetes mellitus without complication    History reviewed. No pertinent past surgical history. Family History  Problem Relation Age of Onset  . Diabetes Other   . Diabetes Sister    History  Substance Use Topics  . Smoking status: Current Every Day Smoker    Types: Cigarettes  . Smokeless tobacco: Not on file  . Alcohol Use: Yes     Comment: occ    Review of Systems  Constitutional: Negative.  Negative for fever.  HENT: Negative.  Negative for congestion and sore throat.   Respiratory: Positive for cough and chest tightness. Negative for shortness of breath and wheezing.   Cardiovascular: Positive for chest pain.  Gastrointestinal: Negative.  Negative for nausea, vomiting and abdominal pain.  Musculoskeletal: Negative.  Negative for myalgias.  Skin: Negative.  Negative for rash.  Neurological: Negative.       Allergies  Review of patient's allergies indicates no known allergies.  Home Medications    Prior to Admission medications   Medication Sig Start Date End Date Taking? Authorizing Provider  insulin NPH-regular Human (HUMULIN 70/30) (70-30) 100 UNIT/ML injection Inject 30 Units into the skin daily with breakfast. 12/29/13   Kristen N Ward, DO  insulin NPH-regular Human (HUMULIN 70/30) (70-30) 100 UNIT/ML injection Inject 20 Units into the skin at bedtime. 12/29/13   Kristen N Ward, DO  insulin NPH-regular Human (NOVOLIN 70/30) (70-30) 100 UNIT/ML injection Inject 20-30 Units into the skin 2 (two) times daily with a meal. 30 units every morning and 20 units at night 11/21/13   Fayrene Helper, PA-C  MINOXIDIL, TOPICAL, (MINOXIDIL FOR MEN) 5 % SOLN Apply 1 application topically daily.    Historical Provider, MD  Multiple Vitamins-Minerals (MULTIVITAMIN WITH MINERALS) tablet Take 1 tablet by mouth daily.    Historical Provider, MD  naproxen (NAPROSYN) 500 MG tablet Take 1 tablet (500 mg total) by mouth 2 (two) times daily. 12/25/13   Dione Booze, MD  Pyridoxine HCl (VITAMIN B-6) 250 MG tablet Take 250 mg by mouth daily.    Historical Provider, MD  traMADol (ULTRAM) 50 MG tablet Take 50 mg by mouth every 6 (six) hours as needed for severe pain.    Historical Provider, MD   BP 116/62  Pulse 82  Temp(Src) 97.7 F (36.5 C) (Oral)  Resp 18  SpO2 97% Physical Exam  Constitutional: He is oriented to person, place, and time. He appears well-developed and well-nourished. No distress.  HENT:  Head: Normocephalic.  Mouth/Throat: Oropharynx is clear and moist.  Eyes: Conjunctivae  are normal.  Neck: Normal range of motion. Neck supple.  Cardiovascular: Normal rate and regular rhythm.   No murmur heard. Pulmonary/Chest: Effort normal. He has no wheezes. He has no rales. He exhibits no tenderness.  Diffuse rhonchi.  Abdominal: Soft. Bowel sounds are normal. There is no tenderness. There is no rebound and no guarding.  Musculoskeletal: Normal range of motion.  Neurological: He is alert and oriented to  person, place, and time.  Skin: Skin is warm and dry. No rash noted.  Psychiatric: He has a normal mood and affect.    ED Course  Procedures (including critical care time) Labs Review Labs Reviewed  CBC - Abnormal; Notable for the following:    WBC 10.6 (*)    MCHC 36.7 (*)    All other components within normal limits  BASIC METABOLIC PANEL - Abnormal; Notable for the following:    Glucose, Bld 174 (*)    All other components within normal limits  I-STAT TROPOININ, ED    Imaging Review Dg Chest 2 View  03/03/2014   CLINICAL DATA:  Chest pain and cough.  EXAM: CHEST  2 VIEW  COMPARISON:  10/24/2013  FINDINGS: The cardiac silhouette, mediastinal and hilar contours are normal and stable. The lungs are clear. No pleural effusion. The bony thorax is intact.  IMPRESSION: No acute cardiopulmonary findings.   Electronically Signed   By: Loralie Champagne M.D.   On: 03/03/2014 23:00     EKG Interpretation None      MDM   Final diagnoses:  None    1. Bronchitis  Suspect viral illness without fever, no leukocytosis, onset today. The patient is concerned for worsening symptoms over the next couple of days as has happened previously requiring antibiotics. Discussed supportive care with abx Rx that he could delay filling in the event his illness follows same course as before. Will prescribe cough medication, OTC recommendations.    Arnoldo Hooker, PA-C 03/04/14 [phone]

## 2014-03-04 NOTE — ED Provider Notes (Signed)
Medical screening examination/treatment/procedure(s) were performed by non-physician practitioner and as supervising physician I was immediately available for consultation/collaboration.    Dione Booze, MD 03/04/14 [phone]

## 2015-05-12 ENCOUNTER — Encounter: Payer: Self-pay | Admitting: Endocrinology

## 2015-05-12 ENCOUNTER — Telehealth: Payer: Self-pay | Admitting: Endocrinology

## 2015-05-12 ENCOUNTER — Ambulatory Visit (INDEPENDENT_AMBULATORY_CARE_PROVIDER_SITE_OTHER): Payer: PPO | Admitting: Endocrinology

## 2015-05-12 VITALS — BP 124/87 | HR 71 | Temp 97.6°F | Ht 68.0 in | Wt 178.0 lb

## 2015-05-12 DIAGNOSIS — E109 Type 1 diabetes mellitus without complications: Secondary | ICD-10-CM | POA: Diagnosis not present

## 2015-05-12 LAB — POCT GLYCOSYLATED HEMOGLOBIN (HGB A1C): HEMOGLOBIN A1C: 9

## 2015-05-12 MED ORDER — BLOOD GLUCOSE TEST VI STRP
ORAL_STRIP | Status: DC
Start: 2015-05-12 — End: 2015-10-07

## 2015-05-12 MED ORDER — INSULIN NPH ISOPHANE & REGULAR (70-30) 100 UNIT/ML ~~LOC~~ SUSP: SUBCUTANEOUS | Status: DC

## 2015-05-12 NOTE — Progress Notes (Signed)
   Subjective:    Patient ID: Kenneth James, male    DOB: 1990-08-30, 24 y.o.   MRN: [id number]  HPI Pt returns for f/u of diabetes mellitus: DM type: 1 Dx'ed: 1999 Complications: none Therapy: insulin since dx  DKA: only at dx Severe hypoglycemia: several episodes (most recently 2015) Pancreatitis: never Other: he changed to bid premixed insulin, after poor results with multiple daily injections.  Interval history: he has mild hypoglycemia approx every twice a week.  This usually happens in the middle of the night.  He takes 30 units qam and 20 units qpm.  He wants to consider pump rx.  He says he never misses the insulin.  Past Medical History  Diagnosis Date  . Diabetes mellitus without complication (HCC)     No past surgical history on file.  Social History   Social History  . Marital Status: Single    Spouse Name: N/A  . Number of Children: N/A  . Years of Education: N/A   Occupational History  . Not on file.   Social History Main Topics  . Smoking status: Current Every Day Smoker    Types: Cigarettes  . Smokeless tobacco: Not on file  . Alcohol Use: Yes     Comment: occ  . Drug Use: No  . Sexual Activity: Not on file   Other Topics Concern  . Not on file   Social History Narrative    No current outpatient prescriptions on file prior to visit.   No current facility-administered medications on file prior to visit.    No Known Allergies  Family History  Problem Relation Age of Onset  . Diabetes Other   . Diabetes Sister     BP 124/87 mmHg  Pulse 71  Temp(Src) 97.6 F (36.4 C) (Oral)  Ht 5\' 8"  (1.727 m)  Wt 178 lb (80.74 kg)  BMI 27.07 kg/m2  SpO2 97%  Review of Systems He has gained weight    Objective:   Physical Exam VITAL SIGNS:  See vs page GENERAL: no distress Pulses: dorsalis pedis intact bilat.   MSK: no deformity of the feet CV: no leg edema Skin:  no ulcer on the feet.  normal color and temp on the feet. Neuro: sensation  is intact to touch on the feet   I have reviewed outside records, and summarized: Multiple ER visits in 2015.    A1c=9.0%    Assessment & Plan:  DM: worse.   Noncompliance with cbg recording and f/u ov's: new to me.  I'll work around this as best I can, but I'm not sure he is a good pump candidate.    Patient is advised the following: Patient Instructions  check your blood sugar 4 times a day: before the 3 meals, and at bedtime.  also check if you have symptoms of your blood sugar being too high or too low.  please keep a record of the readings and bring it to your next appointment here (or you can bring the meter itself).  You can write it on any piece of paper.  please call us sooner if your blood sugar goes below 70, or if you have a lot of readings over 200. Please change the insulin to 35 units with breakfast, and 15 units with the evening meal. Please come back for a follow-up appointment in 2-3 weeks.   Please see Bonita QuinLinda the same day, to consider an insulin pump.

## 2015-05-12 NOTE — Telephone Encounter (Signed)
Rx sent per pt's request.  

## 2015-05-12 NOTE — Telephone Encounter (Signed)
Patient need a prescription of his test strips

## 2015-05-12 NOTE — Patient Instructions (Addendum)
check your blood sugar 4 times a day: before the 3 meals, and at bedtime.  also check if you have symptoms of your blood sugar being too high or too low.  please keep a record of the readings and bring it to your next appointment here (or you can bring the meter itself).  You can write it on any piece of paper.  please call us sooner if your blood sugar goes below 70, or if you have a lot of readings over 200. Please change the insulin to 35 units with breakfast, and 15 units with the evening meal. Please come back for a follow-up appointment in 2-3 weeks.   Please see Bonita QuinLinda the same day, to consider an insulin pump.

## 2015-05-26 ENCOUNTER — Telehealth: Payer: Self-pay | Admitting: Endocrinology

## 2015-05-26 DIAGNOSIS — Z0279 Encounter for issue of other medical certificate: Secondary | ICD-10-CM

## 2015-05-26 NOTE — Telephone Encounter (Signed)
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

## 2015-05-26 NOTE — Telephone Encounter (Signed)
i printed 

## 2015-05-26 NOTE — Telephone Encounter (Signed)
I contacted the pt and advised his letter is ready for pick up.

## 2015-05-26 NOTE — Telephone Encounter (Signed)
See note below and please advise, Thanks! 

## 2015-06-02 ENCOUNTER — Encounter: Payer: Self-pay | Admitting: Endocrinology

## 2015-06-02 ENCOUNTER — Ambulatory Visit (INDEPENDENT_AMBULATORY_CARE_PROVIDER_SITE_OTHER): Payer: PPO | Admitting: Endocrinology

## 2015-06-02 VITALS — BP 136/88 | HR 80 | Temp 97.9°F | Ht 68.0 in | Wt 175.0 lb

## 2015-06-02 DIAGNOSIS — E109 Type 1 diabetes mellitus without complications: Secondary | ICD-10-CM

## 2015-06-02 LAB — LIPID PANEL
CHOLESTEROL: 119 mg/dL (ref 0–200)
HDL: 47 mg/dL (ref >39.00)
LDL Cholesterol: 55 mg/dL (ref 0–99)
NonHDL: 72.35
Total CHOL/HDL Ratio: 3
Triglycerides: 88 mg/dL (ref 0.0–149.0)
VLDL: 17.6 mg/dL (ref 0.0–40.0)

## 2015-06-02 LAB — BASIC METABOLIC PANEL
BUN: 17 mg/dL (ref 6–23)
CO2: 27 mEq/L (ref 19–32)
Calcium: 9.2 mg/dL (ref 8.4–10.5)
Chloride: 100 mEq/L (ref 96–112)
Creatinine, Ser: 0.89 mg/dL (ref 0.40–1.50)
GFR: 112.46 mL/min (ref >60.00)
Glucose, Bld: 386 mg/dL — ABNORMAL HIGH (ref 70–99)
POTASSIUM: 4.3 meq/L (ref 3.5–5.1)
Sodium: 134 mEq/L — ABNORMAL LOW (ref 135–145)

## 2015-06-02 LAB — TSH: TSH: 0.82 u[IU]/mL (ref 0.35–4.50)

## 2015-06-02 NOTE — Progress Notes (Signed)
Subjective:    Patient ID: Kenneth James, male    DOB: 03/20/1991, 24 y.o.   MRN: [id number]  HPI Pt returns for f/u of diabetes mellitus: DM type: 1 Dx'ed: 1999 Complications: none Therapy: insulin since dx  DKA: only at dx Severe hypoglycemia: several episodes (most recently 2015) Pancreatitis: never.  Other: he changed to bid premixed insulin, after poor results with multiple daily injections.  Interval history: no cbg record, but states cbg's vary from 120-200.  It is highest at hs, and lowest in am.  He declines pump rx for now.  Past Medical History  Diagnosis Date  . Diabetes mellitus without complication (HCC)     No past surgical history on file.  Social History   Social History  . Marital Status: Single    Spouse Name: N/A  . Number of Children: N/A  . Years of Education: N/A   Occupational History  . Not on file.   Social History Main Topics  . Smoking status: Current Every Day Smoker    Types: Cigarettes  . Smokeless tobacco: Not on file  . Alcohol Use: Yes     Comment: occ  . Drug Use: No  . Sexual Activity: Not on file   Other Topics Concern  . Not on file   Social History Narrative    Current Outpatient Prescriptions on File Prior to Visit  Medication Sig Dispense Refill  . Glucose Blood (BLOOD GLUCOSE TEST STRIPS) STRP Use to check blood sugar 4 times per day. 150 each 2  . insulin NPH-regular Human (HUMULIN 70/30) (70-30) 100 UNIT/ML injection 35 units with breakfast, and 15 units with the evening meal, and syringes 2/day 20 mL 11  . Insulin Syringe-Needle U-100 (INSULIN SYRINGE 1CC/30GX5/16") 30G X 5/16" 1 ML MISC by Does not apply route. Use to inject insulin 2 times per day     No current facility-administered medications on file prior to visit.    No Known Allergies  Family History  Problem Relation Age of Onset  . Diabetes Other   . Diabetes Sister     BP 136/88 mmHg  Pulse 80  Temp(Src) 97.9 F (36.6 C) (Oral)  Ht 5'  8" (1.727 m)  Wt 175 lb (79.379 kg)  BMI 26.61 kg/m2  SpO2 97%  Review of Systems He denies hypoglycemia.    Objective:   Physical Exam VITAL SIGNS:  See vs page GENERAL: no distress SKIN:  Insulin injection sites at the anterior abdomen are normal.   Lab Results  Component Value Date   HGBA1C 9.0 05/12/2015      Assessment & Plan:  DM: this is the best control this pt should aim for, given this regimen, which does match insulin to his changing needs throughout the day.   Patient is advised the following: Patient Instructions  check your blood sugar 4 times a day: before the 3 meals, and at bedtime.  also check if you have symptoms of your blood sugar being too high or too low.  please keep a record of the readings and bring it to your next appointment here (or you can bring the meter itself).  You can write it on any piece of paper.  please call us sooner if your blood sugar goes below 70, or if you have a lot of readings over 200. Please continue the same insulin blood tests are requested for you today.  We'll let you know about the results. Please come back for a follow-up appointment in 3  months

## 2015-06-02 NOTE — Patient Instructions (Addendum)
check your blood sugar 4 times a day: before the 3 meals, and at bedtime.  also check if you have symptoms of your blood sugar being too high or too low.  please keep a record of the readings and bring it to your next appointment here (or you can bring the meter itself).  You can write it on any piece of paper.  please call us sooner if your blood sugar goes below 70, or if you have a lot of readings over 200. Please continue the same insulin blood tests are requested for you today.  We'll let you know about the results. Please come back for a follow-up appointment in 3 months.

## 2015-06-06 ENCOUNTER — Encounter: Payer: PPO | Admitting: Nutrition

## 2015-06-06 LAB — FRUCTOSAMINE: Fructosamine: 396 umol/L — ABNORMAL HIGH (ref 190–270)

## 2015-08-03 ENCOUNTER — Telehealth: Payer: Self-pay | Admitting: Endocrinology

## 2015-08-03 DIAGNOSIS — Z7689 Persons encountering health services in other specified circumstances: Secondary | ICD-10-CM

## 2015-08-03 NOTE — Telephone Encounter (Signed)
Pt called he said he needs a letter from Dr. Everardo All to be sent to his [employer] for some of his classes? (Pt didn't specify what the letter needed to include)

## 2015-08-03 NOTE — Telephone Encounter (Signed)
please call patient: i need some indication on how often (and for how long) he would miss class

## 2015-08-03 NOTE — Telephone Encounter (Signed)
I contacted the pt. Pt is requesting a letter stating his is our patient being treated for DM and due to blood sugar will sometime miss class.

## 2015-08-04 NOTE — Telephone Encounter (Signed)
Attempted to reach the pt. Will try again at a later time.  

## 2015-08-05 NOTE — Telephone Encounter (Signed)
Attempted to reach the pt. Will try again at a later time.  

## 2015-08-05 NOTE — Telephone Encounter (Signed)
Pt called back and spoke with Judeth Cornfield. He stated he misses about 2 to 3 classes a month. The class last an hour and 15 minutes.

## 2015-08-05 NOTE — Telephone Encounter (Signed)
i printed 

## 2015-08-05 NOTE — Telephone Encounter (Signed)
Pt notified letter is ready for pick up. Letter placed up front.

## 2015-08-31 ENCOUNTER — Ambulatory Visit (INDEPENDENT_AMBULATORY_CARE_PROVIDER_SITE_OTHER): Payer: PPO | Admitting: Endocrinology

## 2015-08-31 ENCOUNTER — Encounter: Payer: Self-pay | Admitting: Endocrinology

## 2015-08-31 VITALS — BP 122/80 | HR 86 | Temp 98.0°F | Ht 68.0 in | Wt 185.0 lb

## 2015-08-31 DIAGNOSIS — E109 Type 1 diabetes mellitus without complications: Secondary | ICD-10-CM | POA: Diagnosis not present

## 2015-08-31 LAB — POCT GLYCOSYLATED HEMOGLOBIN (HGB A1C): Hemoglobin A1C: 7.8

## 2015-08-31 LAB — MICROALBUMIN / CREATININE URINE RATIO
Creatinine,U: 52.9 mg/dL
Microalb Creat Ratio: 1.3 mg/g (ref 0.0–30.0)
Microalb, Ur: <0.7 mg/dL (ref 0.0–1.9)

## 2015-08-31 NOTE — Patient Instructions (Addendum)
check your blood sugar 4 times a day: before the 3 meals, and at bedtime.  also check if you have symptoms of your blood sugar being too high or too low.  please keep a record of the readings and bring it to your next appointment here (or you can bring the meter itself).  You can write it on any piece of paper.  please call us sooner if your blood sugar goes below 70, or if you have a lot of readings over 200. Please continue the same insulin for now. Please see Bonita QuinLinda, to consider using a pump.  Please come back for a follow-up appointment in 3 months.

## 2015-08-31 NOTE — Progress Notes (Signed)
Subjective:    Patient ID: Kenneth James, male    DOB: 10/27/1990, 25 y.o.   MRN: [id number]  HPI Pt returns for f/u of diabetes mellitus: DM type: 1 Dx'ed: 1999 Complications: none Therapy: insulin since dx  DKA: only at dx Severe hypoglycemia: several episodes (most recently 2015). Pancreatitis: never.  Other: he changed to bid premixed insulin, after poor results with multiple daily injections.  Interval history: He seldom has hypoglycemia, and these episodes are mild.  He says it is in general higher in am than later in the day. He wants to consider pump rx.    Past Medical History  Diagnosis Date  . Diabetes mellitus without complication (HCC)     No past surgical history on file.  Social History   Social History  . Marital Status: Single    Spouse Name: N/A  . Number of Children: N/A  . Years of Education: N/A   Occupational History  . Not on file.   Social History Main Topics  . Smoking status: Current Every Day Smoker    Types: Cigarettes  . Smokeless tobacco: Not on file  . Alcohol Use: Yes     Comment: occ  . Drug Use: No  . Sexual Activity: Not on file   Other Topics Concern  . Not on file   Social History Narrative    Current Outpatient Prescriptions on File Prior to Visit  Medication Sig Dispense Refill  . Glucose Blood (BLOOD GLUCOSE TEST STRIPS) STRP Use to check blood sugar 4 times per day. 150 each 2  . insulin NPH-regular Human (HUMULIN 70/30) (70-30) 100 UNIT/ML injection 35 units with breakfast, and 15 units with the evening meal, and syringes 2/day (Patient taking differently: 40 units with breakfast, and 15 units with the evening meal, and syringes 2/day) 20 mL 11  . Insulin Syringe-Needle U-100 (INSULIN SYRINGE 1CC/30GX5/16") 30G X 5/16" 1 ML MISC by Does not apply route. Use to inject insulin 2 times per day     No current facility-administered medications on file prior to visit.    No Known Allergies  Family History  Problem  Relation Age of Onset  . Diabetes Other   . Diabetes Sister     BP 122/80 mmHg  Pulse 86  Temp(Src) 98 F (36.7 C)  Ht 5\' 8"  (1.727 m)  Wt 185 lb (83.915 kg)  BMI 28.14 kg/m2  SpO2 97%   Review of Systems Denies LOC     Objective:   Physical Exam VITAL SIGNS:  See vs page GENERAL: no distress Pulses: dorsalis pedis intact bilat.   MSK: no deformity of the feet CV: no leg edema Skin:  no ulcer on the feet.  normal color and temp on the feet. Neuro: sensation is intact to touch on the feet    A1c=7.8%    Assessment & Plan:  DM: he needs increased rx, if it can be done with a regimen that avoids or minimizes hypoglycemia.   Patient is advised the following: Patient Instructions  check your blood sugar 4 times a day: before the 3 meals, and at bedtime.  also check if you have symptoms of your blood sugar being too high or too low.  please keep a record of the readings and bring it to your next appointment here (or you can bring the meter itself).  You can write it on any piece of paper.  please call us sooner if your blood sugar goes below 70, or if you have  a lot of readings over 200. Please continue the same insulin for now. Please see Bonita Quin, to consider using a pump.  Please come back for a follow-up appointment in 3 months.

## 2015-09-05 ENCOUNTER — Encounter: Payer: PPO | Admitting: Nutrition

## 2015-09-13 ENCOUNTER — Encounter: Payer: PPO | Attending: Endocrinology | Admitting: Nutrition

## 2015-09-13 DIAGNOSIS — E109 Type 1 diabetes mellitus without complications: Secondary | ICD-10-CM

## 2015-09-13 NOTE — Progress Notes (Signed)
Patient is considering an insulin pump, and wanted information on each model.   We discussed the advantages and disadvantages of insulin pump therapy, and he was shown each model.  We discussed the advantages of each model and he was given brochures on each one.  He will look them over and call me if questions.   He was encouraged to go to their consecutive web sites and review each model and how it works.  He agreed to do this.   He is counting carbs, but is not sure if he know this well.  He was given the Nutrition center's number to schedule an appt.for carb counting.  He agreed to do this.   We discussed pump therapy, and how it works--basal and bolus insulin delivery, and he had no final questions.

## 2015-09-14 ENCOUNTER — Emergency Department (HOSPITAL_COMMUNITY): Payer: PPO

## 2015-09-14 ENCOUNTER — Emergency Department (HOSPITAL_COMMUNITY)
Admission: EM | Admit: 2015-09-14 | Discharge: 2015-09-15 | Disposition: A | Discharge status: Home / Self Care | Payer: PPO | Attending: Emergency Medicine | Admitting: Emergency Medicine

## 2015-09-14 ENCOUNTER — Encounter (HOSPITAL_COMMUNITY): Payer: Self-pay | Admitting: Emergency Medicine

## 2015-09-14 DIAGNOSIS — R51 Headache: Secondary | ICD-10-CM | POA: Diagnosis present

## 2015-09-14 DIAGNOSIS — E162 Hypoglycemia, unspecified: Secondary | ICD-10-CM

## 2015-09-14 DIAGNOSIS — Z794 Long term (current) use of insulin: Secondary | ICD-10-CM | POA: Diagnosis not present

## 2015-09-14 DIAGNOSIS — H539 Unspecified visual disturbance: Secondary | ICD-10-CM | POA: Diagnosis not present

## 2015-09-14 DIAGNOSIS — E11649 Type 2 diabetes mellitus with hypoglycemia without coma: Secondary | ICD-10-CM | POA: Insufficient documentation

## 2015-09-14 DIAGNOSIS — R519 Headache, unspecified: Secondary | ICD-10-CM

## 2015-09-14 DIAGNOSIS — F17210 Nicotine dependence, cigarettes, uncomplicated: Secondary | ICD-10-CM | POA: Diagnosis not present

## 2015-09-14 LAB — CBG MONITORING, ED: Glucose-Capillary: 207 mg/dL — ABNORMAL HIGH (ref 65–99)

## 2015-09-14 MED ORDER — DIPHENHYDRAMINE HCL 50 MG/ML IJ SOLN
12.5000 mg | Freq: Once | INTRAMUSCULAR | Status: AC
  Administered 2015-09-14: 12.5 mg via INTRAVENOUS
  Filled 2015-09-14: qty 1

## 2015-09-14 MED ORDER — SODIUM CHLORIDE 0.9 % IV BOLUS (SEPSIS)
1000.0000 mL | Freq: Once | INTRAVENOUS | Status: AC
  Administered 2015-09-14: 1000 mL via INTRAVENOUS

## 2015-09-14 MED ORDER — METOCLOPRAMIDE HCL 5 MG/ML IJ SOLN
10.0000 mg | Freq: Once | INTRAMUSCULAR | Status: AC
  Administered 2015-09-14: 10 mg via INTRAVENOUS
  Filled 2015-09-14: qty 2

## 2015-09-14 NOTE — ED Notes (Signed)
PA at bedside.

## 2015-09-14 NOTE — ED Notes (Addendum)
Pt states that he has DM1 and his CBG was 15 today and he had temporary vision loss. Pt still feels unwell, and cannot concentrate. Alert and oriented. CBG 207.

## 2015-09-14 NOTE — ED Provider Notes (Signed)
CSN: [id number]     Arrival date & time 09/14/15  2050 History   First MD Initiated Contact with Patient 09/14/15 2245     Chief Complaint  Patient presents with  . Hypoglycemia    HPI   Kenneth James is a 25 y.o. male with a PMH of DM who presents to the ED with hypoglycemia. He states he took his usual dose of insulin this morning and fell asleep. He states he usually eats, however did not do so this morning. He reports he woke up and his blood sugar was 15. He notes his vision was blurred and he felt confused. He states his symptoms resolved, though reports he had difficulty with speech while in the waiting room. He currently notes headache. He denies fever, chills, dizziness, lightheadedness, numbness, weakness, chest pain, shortness of breath, abdominal pain, N/V/D/C.   Past Medical History  Diagnosis Date  . Diabetes mellitus without complication (HCC)    History reviewed. No pertinent past surgical history. Family History  Problem Relation Age of Onset  . Diabetes Other   . Diabetes Sister    Social History  Substance Use Topics  . Smoking status: Current Every Day Smoker    Types: Cigarettes  . Smokeless tobacco: None  . Alcohol Use: Yes     Comment: occ      Review of Systems  Constitutional: Negative for fever and chills.  Eyes: Positive for visual disturbance.  Respiratory: Negative for shortness of breath.   Cardiovascular: Negative for chest pain.  Gastrointestinal: Negative for nausea, vomiting, abdominal pain, diarrhea and constipation.  Neurological: Positive for headaches. Negative for dizziness, syncope, weakness, light-headedness and numbness.  All other systems reviewed and are negative.     Allergies  Review of patient's allergies indicates no known allergies.  Home Medications   Prior to Admission medications   Medication Sig Start Date End Date Taking? Authorizing Provider  insulin NPH-regular Human (HUMULIN 70/30) (70-30) 100 UNIT/ML  injection 35 units with breakfast, and 15 units with the evening meal, and syringes 2/day Patient taking differently: 30 units with breakfast, and 20 units with the evening meal, and syringes 2/day 05/12/15  Yes Romero Belling, MD  Glucose Blood (BLOOD GLUCOSE TEST STRIPS) STRP Use to check blood sugar 4 times per day. 05/12/15   Romero Belling, MD  Insulin Syringe-Needle U-100 (INSULIN SYRINGE 1CC/30GX5/16") 30G X 5/16" 1 ML MISC by Does not apply route. Use to inject insulin 2 times per day    Historical Provider, MD    BP 128/75 mmHg  Pulse 91  Temp(Src) 98 F (36.7 C) (Oral)  Resp 18  SpO2 99% Physical Exam  Constitutional: He is oriented to person, place, and time. He appears well-developed and well-nourished. No distress.  HENT:  Head: Normocephalic and atraumatic.  Right Ear: External ear normal.  Left Ear: External ear normal.  Nose: Nose normal.  Mouth/Throat: Uvula is midline, oropharynx is clear and moist and mucous membranes are normal.  Eyes: Conjunctivae, EOM and lids are normal. Pupils are equal, round, and reactive to light. Right eye exhibits no discharge. Left eye exhibits no discharge. No scleral icterus.  Neck: Normal range of motion. Neck supple.  Cardiovascular: Normal rate, regular rhythm, normal heart sounds, intact distal pulses and normal pulses.   Pulmonary/Chest: Effort normal and breath sounds normal. No respiratory distress. He has no wheezes. He has no rales.  Abdominal: Soft. Normal appearance and bowel sounds are normal. He exhibits no distension and no mass. There is  no tenderness. There is no rigidity, no rebound and no guarding.  Musculoskeletal: Normal range of motion. He exhibits no edema or tenderness.  Neurological: He is alert and oriented to person, place, and time. He has normal strength. No cranial nerve deficit or sensory deficit.  Skin: Skin is warm, dry and intact. No rash noted. He is not diaphoretic. No erythema. No pallor.  Psychiatric: He has  a normal mood and affect. His speech is normal and behavior is normal.  Nursing note and vitals reviewed.   ED Course  Procedures (including critical care time)  Labs Review Labs Reviewed  CBC WITH DIFFERENTIAL/PLATELET - Abnormal; Notable for the following:    Neutro Abs 8.1 (*)    All other components within normal limits  BASIC METABOLIC PANEL - Abnormal; Notable for the following:    Glucose, Bld 165 (*)    All other components within normal limits  CBG MONITORING, ED - Abnormal; Notable for the following:    Glucose-Capillary 207 (*)    All other components within normal limits    Imaging Review Ct Head Wo Contrast  09/15/2015  CLINICAL DATA:  Initial evaluation for acute diffuse headache. No trauma. EXAM: CT HEAD WITHOUT CONTRAST TECHNIQUE: Contiguous axial images were obtained from the base of the skull through the vertex without intravenous contrast. COMPARISON:  None. FINDINGS: There is no acute intracranial hemorrhage or infarct. No mass lesion or midline shift. Gray-white matter differentiation is well maintained. Ventricles are normal in size without evidence of hydrocephalus. CSF containing spaces are within normal limits. No extra-axial fluid collection. The calvarium is intact. Orbital soft tissues are within normal limits. Scattered ethmoidal sinus disease present bilaterally. Visualized paranasal sinuses are otherwise clear. No mastoid effusion. Scalp soft tissues are unremarkable. IMPRESSION: 1. Normal head CT.  No acute intracranial process identified. 2. Mild ethmoidal sinusitis. Electronically Signed   By: Rise MuBenjamin  McClintock M.D.   On: 09/15/2015 00:26   I have personally reviewed and evaluated these images and lab results as part of my medical decision-making.   EKG Interpretation None      MDM   Final diagnoses:  Headache  Hypoglycemia    25 year old male presents with episode of hypoglycemia today prior to arrival after taking normal morning dose of  insulin and not eating (takes humulin 70/30). States his blood sugar was 15. Now notes headache. Denies fever, chills, dizziness, lightheadedness, numbness, weakness, chest pain, shortness of breath, abdominal pain, N/V/D/C.  Patient is afebrile. Vital signs stable. Normal neuro exam with no focal deficit. Heart RRR. Lungs clear bilaterally. Abdomen soft, non-tender, non-distended. Patient moves all extremities without difficulty.  CBC negative for leukocytosis or anemia. BMP remarkable for glucose 165. Patient states glucose typically runs in this range. Patient given fluids, reglan, benadryl for headache.  On reassessment, patient reports resolution of headache. He is nontoxic and well-appearing, feel he is stable for discharge at this time. Patient took usual home dose of insulin this morning and did not eat, feel hypoglycemia is likely due to this. Patient to follow-up with PCP in 2-3 days for further evaluation and management of blood glucose control. Strict return precautions discussed. Patient verbalizes his understanding and is in agreement with plan.  BP 128/75 mmHg  Pulse 91  Temp(Src) 98 F (36.7 C) (Oral)  Resp 18  SpO2 99%    Kenneth Gemmalizabeth C Chanae Gemma, PA-C 09/15/15 0103  Arby BarretteMarcy Pfeiffer, MD 09/19/15 [phone]

## 2015-09-15 ENCOUNTER — Emergency Department (HOSPITAL_COMMUNITY): Admission: EM | Admit: 2015-09-15 | Discharge: 2015-09-15 | Discharge status: Absent without leave | Payer: PPO

## 2015-09-15 LAB — BASIC METABOLIC PANEL
Anion gap: 10 (ref 5–15)
BUN: 16 mg/dL (ref 6–20)
CO2: 24 mmol/L (ref 22–32)
Calcium: 9.3 mg/dL (ref 8.9–10.3)
Chloride: 101 mmol/L (ref 101–111)
Creatinine, Ser: 0.79 mg/dL (ref 0.61–1.24)
GFR calc non Af Amer: >60 mL/min (ref >60)
GLUCOSE: 165 mg/dL — AB (ref 65–99)
POTASSIUM: 3.8 mmol/L (ref 3.5–5.1)
Sodium: 135 mmol/L (ref 135–145)

## 2015-09-15 LAB — CBC WITH DIFFERENTIAL/PLATELET
BASOS ABS: 0 10*3/uL (ref 0.0–0.1)
BASOS PCT: 0 %
Eosinophils Absolute: 0 10*3/uL (ref 0.0–0.7)
Eosinophils Relative: 0 %
HEMATOCRIT: 43.4 % (ref 39.0–52.0)
Hemoglobin: 14.8 g/dL (ref 13.0–17.0)
LYMPHS ABS: 1.6 10*3/uL (ref 0.7–4.0)
Lymphocytes Relative: 16 %
MCH: 30.2 pg (ref 26.0–34.0)
MCHC: 34.1 g/dL (ref 30.0–36.0)
MCV: 88.6 fL (ref 78.0–100.0)
MONOS PCT: 5 %
Monocytes Absolute: 0.5 10*3/uL (ref 0.1–1.0)
Neutro Abs: 8.1 10*3/uL — ABNORMAL HIGH (ref 1.7–7.7)
Neutrophils Relative %: 79 %
Platelets: 356 10*3/uL (ref 150–400)
RBC: 4.9 MIL/uL (ref 4.22–5.81)
RDW: 11.7 % (ref 11.5–15.5)
WBC: 10.2 10*3/uL (ref 4.0–10.5)

## 2015-09-15 NOTE — Discharge Instructions (Signed)
1. Medications: usual home medications 2. Treatment: rest, drink plenty of fluids  3. Follow Up: please followup with your primary doctor in 2-3 days for discussion of your diagnoses and further evaluation after today's visit; if you do not have a primary care doctor use the phone number listed in your discharge paperwork to find one; please return to the ER for hypoglycemia, severe pain, new or worsening symptoms   Hypoglycemia Low blood sugar (hypoglycemia) means that the level of sugar in your blood is lower than it should be. Signs of low blood sugar include:  Getting sweaty.  Feeling hungry.  Feeling dizzy or weak.  Feeling sleepier than normal.  Feeling nervous.  Headaches.  Having a fast heartbeat. Low blood sugar can happen fast and can be an emergency. Your doctor can do tests to check your blood sugar level. You can have low blood sugar and not have diabetes. HOME CARE  Check your blood sugar as told by your doctor. If it is less than 70 mg/dl or as told by your doctor, take 1 of the following:  3 to 4 glucose tablets.   cup clear juice.   cup soda pop, not diet.  1 cup milk.  5 to 6 hard candies.  Recheck blood sugar after 15 minutes. Repeat until it is at the right level.  Eat a snack if it is more than 1 hour until the next meal.  Only take medicine as told by your doctor.  Do not skip meals. Eat on time.  Do not drink alcohol except with meals.  Check your blood glucose before driving.  Check your blood glucose before and after exercise.  Always carry treatment with you, such as glucose pills.  Always wear a medical alert bracelet if you have diabetes. GET HELP RIGHT AWAY IF:   Your blood glucose goes below 70 mg/dl or as told by your doctor, and you:  Are confused.  Are not able to swallow.  Pass out (faint).  You cannot treat yourself. You may need someone to help you.  You have low blood sugar problems often.  You have problems  from your medicines.  You are not feeling better after 3 to 4 days.  You have vision changes. MAKE SURE YOU:   Understand these instructions.  Will watch this condition.  Will get help right away if you are not doing well or get worse.   This information is not intended to replace advice given to you by your health care provider. Make sure you discuss any questions you have with your health care provider.   Document Released: 09/05/2009 Document Revised: 07/02/2014 Document Reviewed: 02/15/2015 Elsevier Interactive Patient Education [employer]2016 Elsevier Inc.

## 2015-09-15 NOTE — ED Notes (Signed)
Pt transported to CT ?

## 2015-09-20 NOTE — Patient Instructions (Signed)
Read over brochures given and go to web sites for more information.  If you decide on a model.  Fill out the enclosed questionare and fax to company, or bring to me to fax. Call when pump comes in to schedule a 2 hour training.

## 2015-10-04 ENCOUNTER — Telehealth: Payer: Self-pay | Admitting: Endocrinology

## 2015-10-04 NOTE — Telephone Encounter (Signed)
Patient stated that he need Documentation stating that his Diabetes illness can prevent him from doing general jobs in the work place, when his blood sugar drops, please advise

## 2015-10-05 NOTE — Telephone Encounter (Signed)
I contacted the pt and advised Dr. Everardo AllEllison is currently out of town on will return on 10/10/2015. Pt was advised once Dr. Everardo AllEllison returns we can address with him the noted below. Pt voiced understanding.

## 2015-10-07 ENCOUNTER — Other Ambulatory Visit: Payer: Self-pay | Admitting: Endocrinology

## 2015-10-11 ENCOUNTER — Encounter: Payer: Self-pay | Admitting: Endocrinology

## 2015-10-11 ENCOUNTER — Telehealth: Payer: Self-pay | Admitting: Endocrinology

## 2015-10-11 DIAGNOSIS — Z7689 Persons encountering health services in other specified circumstances: Secondary | ICD-10-CM

## 2015-10-11 NOTE — Telephone Encounter (Signed)
Pt called requesting a letter that states his diabetes can prevent him from doing general jobs in the work place when his blood sugar drops.

## 2015-10-11 NOTE — Telephone Encounter (Signed)
Pt advised letter is ready for pick up. Letter placed up front.

## 2015-10-11 NOTE — Telephone Encounter (Signed)
PT called again asking about the status of the letter he previously called about last week.

## 2015-10-11 NOTE — Telephone Encounter (Signed)
done

## 2015-10-24 ENCOUNTER — Telehealth: Payer: Self-pay | Admitting: Endocrinology

## 2015-10-24 NOTE — Telephone Encounter (Signed)
i need to know the person's name, and the reason for the conversation.

## 2015-10-24 NOTE — Telephone Encounter (Signed)
Patient need stated that his professor need to talk to Dr Everardo AllEllison. [phone] please call

## 2015-10-25 ENCOUNTER — Telehealth: Payer: Self-pay | Admitting: Endocrinology

## 2015-10-25 NOTE — Telephone Encounter (Signed)
Attempted to reach the pt. Pt was unavailable. Will try again at a later time.  

## 2015-10-25 NOTE — Telephone Encounter (Signed)
I contacted the pt . He stated the professor is Cherrie GauzeJohn Cneviva [employer] . The reason for the conversation is due to the letter we printed about the pt having to miss class due to his diabetes. Professors number is [phone]. Pt stated his teacher is working on the final grades and would like to talk with you before giving the final grade to the pt.

## 2015-10-25 NOTE — Telephone Encounter (Signed)
PT returning your phone call 

## 2015-10-25 NOTE — Telephone Encounter (Signed)
i called and left message to call back

## 2015-10-25 NOTE — Telephone Encounter (Deleted)
See below

## 2015-10-25 NOTE — Telephone Encounter (Addendum)
I contacted the pt and advised we have reached out to the teacher and requested a call back.

## 2015-11-02 ENCOUNTER — Other Ambulatory Visit: Payer: Self-pay | Admitting: Endocrinology

## 2015-11-18 ENCOUNTER — Telehealth: Payer: Self-pay | Admitting: Endocrinology

## 2015-11-18 NOTE — Telephone Encounter (Signed)
Please see below.

## 2015-11-18 NOTE — Telephone Encounter (Signed)
Patient ask if it is ok to fast from sunrise to sunset, this is something to do with his religion he stated.please advise

## 2015-11-18 NOTE — Telephone Encounter (Signed)
Returned patients call.  No answer.  Unable to leave message.

## 2015-11-18 NOTE — Telephone Encounter (Signed)
Yes, fine for Ramadan.  Just cut insulin by half, and carefully follow cbg's, and call if problems

## 2015-11-22 NOTE — Telephone Encounter (Signed)
Attempted to reach the pt. Pt was unavailable. Will try again at a later time.  

## 2015-11-22 NOTE — Telephone Encounter (Signed)
I contacted the pt. Pt stated last year he cut his insulin by half and before the sun set his blood sugar dropped to the 50s. Please advise, Thanks!

## 2015-11-22 NOTE — Telephone Encounter (Signed)
Ok, please change to 12 units qam and 20 units qpm Please call if this does not help

## 2015-11-22 NOTE — Telephone Encounter (Signed)
I contacted the pt and advised of note below. Pt voiced understanding.  

## 2015-12-01 ENCOUNTER — Encounter: Payer: Self-pay | Admitting: Endocrinology

## 2015-12-01 ENCOUNTER — Ambulatory Visit (INDEPENDENT_AMBULATORY_CARE_PROVIDER_SITE_OTHER): Payer: PPO | Admitting: Endocrinology

## 2015-12-01 VITALS — BP 135/69 | HR 78 | Temp 98.1°F | Resp 16 | Ht 68.0 in | Wt 186.5 lb

## 2015-12-01 DIAGNOSIS — Z531 Procedure and treatment not carried out because of patient's decision for reasons of belief and group pressure: Secondary | ICD-10-CM | POA: Diagnosis not present

## 2015-12-01 DIAGNOSIS — E109 Type 1 diabetes mellitus without complications: Secondary | ICD-10-CM

## 2015-12-01 LAB — POCT GLYCOSYLATED HEMOGLOBIN (HGB A1C): Hemoglobin A1C: 8.3

## 2015-12-01 MED ORDER — INSULIN NPH ISOPHANE & REGULAR (70-30) 100 UNIT/ML ~~LOC~~ SUSP: SUBCUTANEOUS | Status: DC

## 2015-12-01 NOTE — Progress Notes (Signed)
Pre visit review using our clinic review tool, if applicable. No additional management support is needed unless otherwise documented below in the visit note. 

## 2015-12-01 NOTE — Progress Notes (Signed)
Subjective:    Patient ID: [name], male    DOB: [birth year], 25 y.o.   MRN: [id number]  HPI Pt returns for f/u of diabetes mellitus: DM type: 1 Dx'ed: 1999 Complications: none Therapy: insulin since dx  DKA: only at dx Severe hypoglycemia: several episodes (most recently early 2017).   Pancreatitis: never.  Other: he changed to bid premixed insulin, after poor results with multiple daily injections; he declines pump rx.  Interval history: For the Ramadan month, he has changed to 12 units qam and 20 units qpm.  no cbg record, but states cbg's are frequently mildly low in the afternoon.  3 mos ago, when taking 40 am and 15 pm, he had an episode of severe hypoglycemia in the afternoon, after he had missed lunch. Past Medical History  Diagnosis Date  . Diabetes mellitus without complication (HCC)     History reviewed. No pertinent past surgical history.  Social History   Social History  . Marital Status: Single    Spouse Name: N/A  . Number of Children: N/A  . Years of Education: N/A   Occupational History  . Not on file.   Social History Main Topics  . Smoking status: Former Smoker -- 1.00 packs/day for 5 years    Types: Cigarettes    Quit date: 05/25/2014  . Smokeless tobacco: Not on file  . Alcohol Use: Yes     Comment: occ  . Drug Use: No  . Sexual Activity: Not on file   Other Topics Concern  . Not on file   Social History Narrative    Current Outpatient Prescriptions on File Prior to Visit  Medication Sig Dispense Refill  . B-D INS SYR ULTRAFINE 1CC/31G 31G X 5/16" 1 ML MISC USE AS DIRECTED 100 each 2  . Insulin Syringe-Needle U-100 (INSULIN SYRINGE 1CC/30GX5/16") 30G X 5/16" 1 ML MISC by Does not apply route. Use to inject insulin 2 times per day    . ONE TOUCH ULTRA TEST test strip USE TO CHECK BLOOD SUGAR 4 TIMES PER DAY. 150 each 2   No current facility-administered medications on file prior to visit.    No Known Allergies  Family History   Problem Relation Age of Onset  . Diabetes Other   . Diabetes Sister     BP 135/69 mmHg  Pulse 78  Temp(Src) 98.1 F (36.7 C) (Oral)  Resp 16  Ht  (1.727 m)  Wt 186 lb 8 oz (84.596 kg)  BMI 28.36 kg/m2    Review of Systems No weight change    Objective:   Physical Exam VITAL SIGNS:  See vs page GENERAL: no distress Pulses: dorsalis pedis intact bilat.   MSK: no deformity of the feet CV: no leg edema Skin:  no ulcer on the feet.  normal color and temp on the feet. Neuro: sensation is intact to touch on the feet   A1c=8.3%    Assessment & Plan:  Type 1 DM: The pattern of his cbg's indicates he needs some adjustment in his therapy.   Religious choice: he need to fast during the day, for approx 2 more weeks. Severe hypoglycemia: I told pt that while we need to avoid this as best we can, he should accept some risk in the interest of aggressive glycemic control.     Patient is advised the following: Patient Instructions  For the rest of the Ramadan month, take 10 units with breakfast and 22 units with the evening meal.  After  the end of Ramadan, change to 35 units with breakfast, and 15 units with the evening meal.  On this type of insulin schedule, you should eat meals on a regular schedule.  If a meal is missed or significantly delayed, your blood sugar could go low.   check your blood sugar 4 times a day.  vary the time of day when you check, between before the 3 meals, and at bedtime.  also check if you have symptoms of your blood sugar being too high or too low.  please keep a record of the readings and bring it to your next appointment here (or you can bring the meter itself).  You can write it on any piece of paper.  please call us sooner if your blood sugar goes below 70, or if you have a lot of readings over 200.  Please come back for a follow-up appointment in 3 months.    Romero BellingELLISON, Sydny Schnitzler, MD

## 2015-12-01 NOTE — Patient Instructions (Addendum)
For the rest of the Ramadan month, take 10 units with breakfast and 22 units with the evening meal.  After the end of Ramadan, change to 35 units with breakfast, and 15 units with the evening meal.  On this type of insulin schedule, you should eat meals on a regular schedule.  If a meal is missed or significantly delayed, your blood sugar could go low.   check your blood sugar 4 times a day.  vary the time of day when you check, between before the 3 meals, and at bedtime.  also check if you have symptoms of your blood sugar being too high or too low.  please keep a record of the readings and bring it to your next appointment here (or you can bring the meter itself).  You can write it on any piece of paper.  please call us sooner if your blood sugar goes below 70, or if you have a lot of readings over 200.  Please come back for a follow-up appointment in 3 months.

## 2015-12-03 DIAGNOSIS — Z531 Procedure and treatment not carried out because of patient's decision for reasons of belief and group pressure: Secondary | ICD-10-CM | POA: Insufficient documentation

## 2016-01-06 ENCOUNTER — Telehealth: Payer: Self-pay | Admitting: Endocrinology

## 2016-01-06 NOTE — Telephone Encounter (Signed)
Patient ask if supplement Creatine is ok to take? Please advise

## 2016-01-09 NOTE — Telephone Encounter (Signed)
See below and please advise, Thanks!  

## 2016-01-09 NOTE — Telephone Encounter (Signed)
I contacted the pt and advised per Dr. Everardo AllEllison it was ok for him to take the supplement creatine. Pt voiced understanding.

## 2016-01-09 NOTE — Telephone Encounter (Signed)
ok 

## 2016-03-02 ENCOUNTER — Ambulatory Visit (INDEPENDENT_AMBULATORY_CARE_PROVIDER_SITE_OTHER): Payer: PPO | Admitting: Endocrinology

## 2016-03-02 ENCOUNTER — Encounter: Payer: Self-pay | Admitting: Endocrinology

## 2016-03-02 VITALS — BP 132/80 | HR 96 | Ht 68.0 in | Wt 190.0 lb

## 2016-03-02 DIAGNOSIS — E109 Type 1 diabetes mellitus without complications: Secondary | ICD-10-CM | POA: Diagnosis not present

## 2016-03-02 LAB — POCT GLYCOSYLATED HEMOGLOBIN (HGB A1C): Hemoglobin A1C: 7.8

## 2016-03-02 MED ORDER — LOSARTAN POTASSIUM 25 MG PO TABS: 25.0000 mg | ORAL_TABLET | Freq: Every day | ORAL | 11 refills | Status: DC

## 2016-03-02 MED ORDER — GLUCOSE BLOOD VI STRP: ORAL_STRIP | 2 refills | Status: DC

## 2016-03-02 MED ORDER — INSULIN NPH ISOPHANE & REGULAR (70-30) 100 UNIT/ML ~~LOC~~ SUSP: SUBCUTANEOUS | 11 refills | Status: DC

## 2016-03-02 NOTE — Patient Instructions (Addendum)
Please take 35 units with breakfast (25 if you are going to to exercise), and 16 units with the evening meal.  On this type of insulin schedule, you should eat meals on a regular schedule.  If a meal is missed or significantly delayed, your blood sugar could go low.   check your blood sugar 4 times a day.  vary the time of day when you check, between before the 3 meals, and at bedtime.  also check if you have symptoms of your blood sugar being too high or too low.  please keep a record of the readings and bring it to your next appointment here (or you can bring the meter itself).  You can write it on any piece of paper.  please call us sooner if your blood sugar goes below 70, or if you have a lot of readings over 200.  I have sent a prescription to your pharmacy, for a blood pressure pill.  This helps to reduce your chances of getting complications of diabetes.   Please come back for a follow-up appointment in 3 months.

## 2016-03-02 NOTE — Progress Notes (Signed)
   Subjective:    Patient ID: Kenneth James, male    DOB: [birth year], 25 y.o.   MRN: [id number]  HPI Pt returns for f/u of diabetes mellitus: DM type: 1 Dx'ed: 1999 Complications: none.   Therapy: insulin since dx.  DKA: only at dx.  Severe hypoglycemia: several episodes (most recently twice in 2017).   Pancreatitis: never.  Other: he changed to bid premixed insulin, after poor results with multiple daily injections; he declines pump rx.   Interval history: pt states he feels well in general.  no cbg record, but states cbg's are mildly low approx every other day.  This usually happens in the afternoon, with exercise.  It is highest in am.   Past Medical History:  Diagnosis Date  . Diabetes mellitus without complication (HCC)     No past surgical history on file.  Social History   Social History  . Marital status: Single    Spouse name: N/A  . Number of children: N/A  . Years of education: N/A   Occupational History  . Not on file.   Social History Main Topics  . Smoking status: Former Smoker    Packs/day: 1.00    Years: 5.00    Types: Cigarettes    Quit date: 05/25/2014  . Smokeless tobacco: Not on file  . Alcohol use Yes     Comment: occ  . Drug use: No  . Sexual activity: Not on file   Other Topics Concern  . Not on file   Social History Narrative  . No narrative on file    Current Outpatient Prescriptions on File Prior to Visit  Medication Sig Dispense Refill  . B-D INS SYR ULTRAFINE 1CC/31G 31G X 5/16" 1 ML MISC USE AS DIRECTED 100 each 2  . Insulin Syringe-Needle U-100 (INSULIN SYRINGE 1CC/30GX5/16") 30G X 5/16" 1 ML MISC by Does not apply route. Use to inject insulin 2 times per day     No current facility-administered medications on file prior to visit.     No Known Allergies  Family History  Problem Relation Age of Onset  . Diabetes Other   . Diabetes Sister     BP 132/80   Pulse 96   Ht 5\' 8"  (1.727 m)   Wt 190 lb (86.2 kg)   SpO2  95%   BMI 28.89 kg/m    Review of Systems No weight change    Objective:   Physical Exam VITAL SIGNS:  See vs page GENERAL: no distress Pulses: dorsalis pedis intact bilat.   MSK: no deformity of the feet CV: no leg edema Skin:  no ulcer on the feet.  normal color and temp on the feet. Neuro: sensation is intact to touch on the feet.    A1c=7.8%     Assessment & Plan:  Type 1 DM: The pattern of his cbg's indicates he needs some adjustment in his therapy.   severe hypoglycemia: we discussed.  At his age, he is still a candidate for aggressive glycemic control.

## 2016-03-16 ENCOUNTER — Other Ambulatory Visit: Payer: Self-pay | Admitting: Endocrinology

## 2016-03-17 ENCOUNTER — Other Ambulatory Visit: Payer: Self-pay | Admitting: Endocrinology

## 2016-04-17 ENCOUNTER — Telehealth: Payer: Self-pay | Admitting: Endocrinology

## 2016-04-17 MED ORDER — INSULIN NPH ISOPHANE & REGULAR (70-30) 100 UNIT/ML ~~LOC~~ SUSP: SUBCUTANEOUS | 11 refills | Status: DC

## 2016-04-17 NOTE — Telephone Encounter (Signed)
Refill submitted per patient's request.  

## 2016-04-17 NOTE — Telephone Encounter (Signed)
Patient want Dr Everardo AllEllison to add Rapid insulin to his HUMULIN 70/30. Send to  [id number] [place], [place] - [address] [phone] (Phone) [phone] (Fax)

## 2016-04-23 MED ORDER — INSULIN NPH ISOPHANE & REGULAR (70-30) 100 UNIT/ML ~~LOC~~ SUSP: SUBCUTANEOUS | 11 refills | Status: DC

## 2016-04-23 NOTE — Telephone Encounter (Signed)
Attempted to reach the patient. Patient was unavailable and did not have a working voicemail. Will try again at a later time.  

## 2016-04-23 NOTE — Telephone Encounter (Signed)
Refill submitted originally on 04/17/2016 refill submitted again on 04/23/2016.

## 2016-04-23 NOTE — Telephone Encounter (Signed)
[facility] stated that [facility]'t [facility] medication the Rapid acting insulin.please advise

## 2016-04-23 NOTE — Telephone Encounter (Signed)
I contacted the patient and advised the message. Patient has been scheduled for 04/25/2016.

## 2016-04-23 NOTE — Telephone Encounter (Signed)
This would be a different direction for therapy, which we should discuss at an ov

## 2016-04-23 NOTE — Telephone Encounter (Signed)
Patient stated the pharmacy only has insulin Humulin 70/30, he did not want that.   He want the Rapid acting insulin. [facility] to [facility]  Please resubmit

## 2016-04-23 NOTE — Telephone Encounter (Signed)
Pt called in saying he was returning a call and that he requests call back from Dr. George HughEllison's nurse.

## 2016-04-23 NOTE — Telephone Encounter (Signed)
See message and please advise on how to proceed.  

## 2016-04-25 ENCOUNTER — Encounter: Payer: Self-pay | Admitting: Endocrinology

## 2016-04-25 ENCOUNTER — Ambulatory Visit (INDEPENDENT_AMBULATORY_CARE_PROVIDER_SITE_OTHER): Payer: PPO | Admitting: Endocrinology

## 2016-04-25 VITALS — BP 138/80 | HR 98 | Ht 68.0 in | Wt 190.0 lb

## 2016-04-25 DIAGNOSIS — E109 Type 1 diabetes mellitus without complications: Secondary | ICD-10-CM | POA: Diagnosis not present

## 2016-04-25 MED ORDER — INSULIN NPH ISOPHANE & REGULAR (70-30) 100 UNIT/ML ~~LOC~~ SUSP: SUBCUTANEOUS | 11 refills | Status: DC

## 2016-04-25 NOTE — Patient Instructions (Addendum)
Please take 30 units with breakfast (20 if you are going to to exercise), and 20 units with the evening meal.  On this type of insulin schedule, you should eat meals on a regular schedule.  If a meal is missed or significantly delayed, your blood sugar could go low.   check your blood sugar 4 times a day.  vary the time of day when you check, between before the 3 meals, and at bedtime.  also check if you have symptoms of your blood sugar being too high or too low.  please keep a record of the readings and bring it to your next appointment here (or you can bring the meter itself).  You can write it on any piece of paper.  please call us sooner if your blood sugar goes below 70, or if you have a lot of readings over 200.  Please come back for a follow-up appointment in 3 months.

## 2016-04-25 NOTE — Progress Notes (Signed)
Subjective:    Patient ID: Kenneth BondMohammed Misko, male    DOB: 02-01-1991, 25 y.o.   MRN: [id number]  HPI Pt returns for f/u of diabetes mellitus: DM type: 1 Dx'ed: 1999 Complications: none.   Therapy: insulin since dx.  DKA: only at dx.  Severe hypoglycemia: several episodes (most recently twice in 2017).   Pancreatitis: never.  Other: he changed to bid premixed insulin, after poor results with multiple daily injections; he declines pump rx; he is a Physicist, medicalfull-time student.   Interval history: pt states he feels well in general.  no cbg record, but states cbg's are mildly low 2-3 times per week.  This usually happens in the afternoon, with exercise, which is unanticipated (and he has therefore taken 35 units that AM).  However, he says it is still often mildly low if he is active.  It is still highest fasting.   Past Medical History:  Diagnosis Date  . Diabetes mellitus without complication (HCC)     No past surgical history on file.  Social History   Social History  . Marital status: Single    Spouse name: N/A  . Number of children: N/A  . Years of education: N/A   Occupational History  . Not on file.   Social History Main Topics  . Smoking status: Former Smoker    Packs/day: 1.00    Years: 5.00    Types: Cigarettes    Quit date: 05/25/2014  . Smokeless tobacco: Not on file  . Alcohol use Yes     Comment: occ  . Drug use: No  . Sexual activity: Not on file   Other Topics Concern  . Not on file   Social History Narrative  . No narrative on file    Current Outpatient Prescriptions on File Prior to Visit  Medication Sig Dispense Refill  . glucose blood (ONE TOUCH ULTRA TEST) test strip 7 times per day, and lancets 7/day 150 each 2  . Insulin Syringe-Needle U-100 (INSULIN SYRINGE 1CC/30GX5/16") 30G X 5/16" 1 ML MISC by Does not apply route. Use to inject insulin 2 times per day    . losartan (COZAAR) 25 MG tablet Take 1 tablet (25 mg total) by mouth daily. 30 tablet 11     No current facility-administered medications on file prior to visit.     No Known Allergies  Family History  Problem Relation Age of Onset  . Diabetes Other   . Diabetes Sister     BP 138/80   Pulse 98   Ht 5\' 8"  (1.727 m)   Wt 190 lb (86.2 kg)   SpO2 97%   BMI 28.89 kg/m   Review of Systems Denies LOC    Objective:   Physical Exam VITAL SIGNS:  See vs page GENERAL: no distress Pulses: dorsalis pedis intact bilat.   MSK: no deformity of the feet CV: no leg edema Skin:  no ulcer on the feet.  normal color and temp on the feet. Neuro: sensation is intact to touch on the feet.   Lab Results  Component Value Date   HGBA1C 7.8 03/02/2016      Assessment & Plan:  Type 1 DM: The pattern of his cbg's indicates he needs some adjustment in his therapy.   Patient is advised the following: Patient Instructions  Please take 30 units with breakfast (20 if you are going to to exercise), and 20 units with the evening meal.  On this type of insulin schedule, you should eat meals  on a regular schedule.  If a meal is missed or significantly delayed, your blood sugar could go low.   check your blood sugar 4 times a day.  vary the time of day when you check, between before the 3 meals, and at bedtime.  also check if you have symptoms of your blood sugar being too high or too low.  please keep a record of the readings and bring it to your next appointment here (or you can bring the meter itself).  You can write it on any piece of paper.  please call us sooner if your blood sugar goes below 70, or if you have a lot of readings over 200.  Please come back for a follow-up appointment in 3 months.

## 2016-05-04 ENCOUNTER — Telehealth: Payer: Self-pay | Admitting: Endocrinology

## 2016-05-04 NOTE — Telephone Encounter (Signed)
Pt called and requests a letter be written by Dr. Everardo AllEllison stating that he is a Type 1 diabetic and therefore he misses about 3 classes a month.  It is for [employer].  He would like to come pick it up himself when it is ready.

## 2016-05-04 NOTE — Telephone Encounter (Signed)
We can't certify this.  However, if you should have another severely low blood sugar, please let us know.

## 2016-05-04 NOTE — Telephone Encounter (Signed)
We recently reduced the insulin.  If you are still having lows, please let us know, so we can adjust again.

## 2016-05-04 NOTE — Telephone Encounter (Signed)
Ok to type

## 2016-05-04 NOTE — Telephone Encounter (Signed)
He said he gets low blood sugars at least twice a week around 30 and 40.

## 2016-05-04 NOTE — Telephone Encounter (Signed)
Unable to leave message

## 2016-05-07 ENCOUNTER — Encounter: Payer: Self-pay | Admitting: Endocrinology

## 2016-05-07 NOTE — Telephone Encounter (Signed)
I printed letter.

## 2016-05-07 NOTE — Telephone Encounter (Signed)
Patient stated that he need a letter stated that he has diabetes.

## 2016-05-07 NOTE — Telephone Encounter (Signed)
See message and please advise, Thanks!  

## 2016-05-07 NOTE — Telephone Encounter (Signed)
I contacted the patient and advised the letter is ready for pick up. Patient voiced understanding and stated he would come by later today to pick the medication up. Patient had no further questions at this time.

## 2016-06-01 ENCOUNTER — Ambulatory Visit: Payer: PPO | Admitting: Endocrinology

## 2016-06-01 DIAGNOSIS — Z0289 Encounter for other administrative examinations: Secondary | ICD-10-CM

## 2016-07-24 ENCOUNTER — Other Ambulatory Visit: Payer: Self-pay | Admitting: Endocrinology

## 2016-07-27 ENCOUNTER — Ambulatory Visit: Payer: PPO | Admitting: Endocrinology

## 2016-08-07 ENCOUNTER — Ambulatory Visit: Payer: PPO | Admitting: Endocrinology

## 2016-08-16 ENCOUNTER — Telehealth: Payer: Self-pay | Admitting: Endocrinology

## 2016-08-16 NOTE — Telephone Encounter (Signed)
See message and please advise, Thanks!  

## 2016-08-16 NOTE — Telephone Encounter (Signed)
Possible, if it is a high dosage.  I'll see you tomorrow.

## 2016-08-16 NOTE — Telephone Encounter (Signed)
Can the pt take vitamin b6?  Pt took it yesterday and his heart began to race. Could this be from the vit b 6

## 2016-08-16 NOTE — Telephone Encounter (Signed)
I contacted the patient and advised of message. Patient voiced understanding.  

## 2016-08-17 ENCOUNTER — Encounter: Payer: Self-pay | Admitting: Endocrinology

## 2016-08-17 ENCOUNTER — Ambulatory Visit (INDEPENDENT_AMBULATORY_CARE_PROVIDER_SITE_OTHER): Payer: PPO | Admitting: Endocrinology

## 2016-08-17 VITALS — BP 128/82 | HR 92 | Ht 68.0 in | Wt 201.0 lb

## 2016-08-17 DIAGNOSIS — E109 Type 1 diabetes mellitus without complications: Secondary | ICD-10-CM | POA: Diagnosis not present

## 2016-08-17 DIAGNOSIS — Z0000 Encounter for general adult medical examination without abnormal findings: Secondary | ICD-10-CM

## 2016-08-17 LAB — CBC WITH DIFFERENTIAL/PLATELET
Basophils Absolute: 0 10*3/uL (ref 0.0–0.1)
Basophils Relative: 0.6 % (ref 0.0–3.0)
EOS PCT: 1.7 % (ref 0.0–5.0)
Eosinophils Absolute: 0.1 10*3/uL (ref 0.0–0.7)
HCT: 43.3 % (ref 39.0–52.0)
HEMOGLOBIN: 15 g/dL (ref 13.0–17.0)
Lymphocytes Relative: 34.3 % (ref 12.0–46.0)
Lymphs Abs: 2.3 10*3/uL (ref 0.7–4.0)
MCHC: 34.6 g/dL (ref 30.0–36.0)
MCV: 86.4 fl (ref 78.0–100.0)
MONO ABS: 0.4 10*3/uL (ref 0.1–1.0)
Monocytes Relative: 6 % (ref 3.0–12.0)
Neutro Abs: 3.9 10*3/uL (ref 1.4–7.7)
Neutrophils Relative %: 57.4 % (ref 43.0–77.0)
Platelets: 374 10*3/uL (ref 150.0–400.0)
RBC: 5.01 Mil/uL (ref 4.22–5.81)
RDW: 12.6 % (ref 11.5–15.5)
WBC: 6.8 10*3/uL (ref 4.0–10.5)

## 2016-08-17 LAB — HEPATIC FUNCTION PANEL
ALT: 63 U/L — AB (ref 0–53)
AST: 159 U/L — ABNORMAL HIGH (ref 0–37)
Albumin: 4.3 g/dL (ref 3.5–5.2)
Alkaline Phosphatase: 82 U/L (ref 39–117)
BILIRUBIN TOTAL: 0.5 mg/dL (ref 0.2–1.2)
Bilirubin, Direct: 0.1 mg/dL (ref 0.0–0.3)
Total Protein: 7.2 g/dL (ref 6.0–8.3)

## 2016-08-17 LAB — MICROALBUMIN / CREATININE URINE RATIO
Creatinine,U: 78.1 mg/dL
MICROALB UR: 1 mg/dL (ref 0.0–1.9)
Microalb Creat Ratio: 1.3 mg/g (ref 0.0–30.0)

## 2016-08-17 LAB — LIPID PANEL
CHOL/HDL RATIO: 3
Cholesterol: 114 mg/dL (ref 0–200)
HDL: 44.4 mg/dL (ref >39.00)
LDL Cholesterol: 61 mg/dL (ref 0–99)
NONHDL: 69.14
Triglycerides: 43 mg/dL (ref 0.0–149.0)
VLDL: 8.6 mg/dL (ref 0.0–40.0)

## 2016-08-17 LAB — BASIC METABOLIC PANEL
BUN: 9 mg/dL (ref 6–23)
CO2: 28 mEq/L (ref 19–32)
Calcium: 9.3 mg/dL (ref 8.4–10.5)
Chloride: 105 mEq/L (ref 96–112)
Creatinine, Ser: 0.87 mg/dL (ref 0.40–1.50)
GFR: 114.26 mL/min (ref >60.00)
Glucose, Bld: 52 mg/dL — ABNORMAL LOW (ref 70–99)
POTASSIUM: 3.9 meq/L (ref 3.5–5.1)
SODIUM: 139 meq/L (ref 135–145)

## 2016-08-17 LAB — POCT GLYCOSYLATED HEMOGLOBIN (HGB A1C): Hemoglobin A1C: 6.8

## 2016-08-17 LAB — TSH: TSH: 1.16 u[IU]/mL (ref 0.35–4.50)

## 2016-08-17 NOTE — Progress Notes (Signed)
Subjective:    Patient ID: Kenneth James, male    DOB: 1991/03/02, 26 y.o.   MRN: [id number]  HPI Pt returns for f/u of diabetes mellitus: DM type: 1 Dx'ed: 1999 Complications: none.   Therapy: insulin since dx.  DKA: only at dx.  Severe hypoglycemia: several episodes (most recently twice in 2017).   Pancreatitis: never.  Other: he changed to bid premixed insulin, after poor results with multiple daily injections; he declines pump rx; he is a Physicist, medicalfull-time student.   Interval history: He has mild hypoglycemia almost daily.  This happens in the afternoon, when he is active, and takes 20 units that AM.   Pt states 1 week of intermittent moderate palpitions in the chest.  He feels this might have been due to taking niacin.   Past Medical History:  Diagnosis Date  . Diabetes mellitus without complication (HCC)     No past surgical history on file.  Social History   Social History  . Marital status: Single    Spouse name: N/A  . Number of children: N/A  . Years of education: N/A   Occupational History  . Not on file.   Social History Main Topics  . Smoking status: Former Smoker    Packs/day: 1.00    Years: 5.00    Types: Cigarettes    Quit date: 05/25/2014  . Smokeless tobacco: Never Used  . Alcohol use Yes     Comment: occ  . Drug use: No  . Sexual activity: Not on file   Other Topics Concern  . Not on file   Social History Narrative  . No narrative on file    Current Outpatient Prescriptions on File Prior to Visit  Medication Sig Dispense Refill  . insulin NPH-regular Human (HUMULIN 70/30) (70-30) 100 UNIT/ML injection 30 units with breakfast, and 20 units with the evening meal, and syringes 2/day 20 mL 11  . Insulin Syringe-Needle U-100 (INSULIN SYRINGE 1CC/30GX5/16") 30G X 5/16" 1 ML MISC by Does not apply route. Use to inject insulin 2 times per day    . losartan (COZAAR) 25 MG tablet Take 1 tablet (25 mg total) by mouth daily. 30 tablet 11  . ONE TOUCH  ULTRA TEST test strip TEST 7 TIMES PER DAY, AND LANCETS 7/DAY 150 each 2   No current facility-administered medications on file prior to visit.     No Known Allergies  Family History  Problem Relation Age of Onset  . Diabetes Other   . Diabetes Sister     BP 128/82   Pulse 92   Ht 5\' 8"  (1.727 m)   Wt 201 lb (91.2 kg)   SpO2 97%   BMI 30.56 kg/m    Review of Systems Denies LOC.    Objective:   Physical Exam VITAL SIGNS:  See vs page GENERAL: no distress LUNGS:  Clear to auscultation HEART:  Regular rate and rhythm without murmurs noted. Normal S1,S2.   Pulses: dorsalis pedis intact bilat.   MSK: no deformity of the feet CV: no leg edema Skin:  no ulcer on the feet.  normal color and temp on the feet. Neuro: sensation is intact to touch on the feet.   A1c=6.8%    Assessment & Plan:  Palpitations, new.  Check TSH Type 1 DM: overcontrolled, given this regimen, which does match insulin to her changing needs throughout the day. Patient is advised the following: Patient Instructions  Please take 30 units with breakfast (18 if you are going  to to exercise), and 20 units with the evening meal.  On this type of insulin schedule, you should eat meals on a regular schedule.  If a meal is missed or significantly delayed, your blood sugar could go low.   check your blood sugar 4 times a day.  vary the time of day when you check, between before the 3 meals, and at bedtime.  also check if you have symptoms of your blood sugar being too high or too low.  please keep a record of the readings and bring it to your next appointment here (or you can bring the meter itself).  You can write it on any piece of paper.  please call us sooner if your blood sugar goes below 70, or if you have a lot of readings over 200.  blood tests are requested for you today.  We'll let you know about the results.  Please come back for a follow-up appointment in 3 months.

## 2016-08-17 NOTE — Patient Instructions (Addendum)
Please take 30 units with breakfast (18 if you are going to to exercise), and 20 units with the evening meal.  On this type of insulin schedule, you should eat meals on a regular schedule.  If a meal is missed or significantly delayed, your blood sugar could go low.   check your blood sugar 4 times a day.  vary the time of day when you check, between before the 3 meals, and at bedtime.  also check if you have symptoms of your blood sugar being too high or too low.  please keep a record of the readings and bring it to your next appointment here (or you can bring the meter itself).  You can write it on any piece of paper.  please call us sooner if your blood sugar goes below 70, or if you have a lot of readings over 200.  blood tests are requested for you today.  We'll let you know about the results.  Please come back for a follow-up appointment in 3 months.

## 2016-09-09 ENCOUNTER — Emergency Department (HOSPITAL_COMMUNITY): Payer: PPO

## 2016-09-09 ENCOUNTER — Emergency Department (HOSPITAL_COMMUNITY)
Admission: EM | Admit: 2016-09-09 | Discharge: 2016-09-09 | Disposition: A | Discharge status: Home / Self Care | Payer: PPO | Attending: Emergency Medicine | Admitting: Emergency Medicine

## 2016-09-09 ENCOUNTER — Encounter (HOSPITAL_COMMUNITY): Payer: Self-pay | Admitting: Emergency Medicine

## 2016-09-09 DIAGNOSIS — Z87891 Personal history of nicotine dependence: Secondary | ICD-10-CM | POA: Diagnosis not present

## 2016-09-09 DIAGNOSIS — M7918 Myalgia, other site: Secondary | ICD-10-CM

## 2016-09-09 DIAGNOSIS — E109 Type 1 diabetes mellitus without complications: Secondary | ICD-10-CM | POA: Diagnosis not present

## 2016-09-09 DIAGNOSIS — R079 Chest pain, unspecified: Secondary | ICD-10-CM | POA: Diagnosis not present

## 2016-09-09 LAB — BASIC METABOLIC PANEL
ANION GAP: 7 (ref 5–15)
BUN: 10 mg/dL (ref 6–20)
CALCIUM: 8.7 mg/dL — AB (ref 8.9–10.3)
CO2: 25 mmol/L (ref 22–32)
Chloride: 99 mmol/L — ABNORMAL LOW (ref 101–111)
Creatinine, Ser: 0.84 mg/dL (ref 0.61–1.24)
GLUCOSE: 222 mg/dL — AB (ref 65–99)
Potassium: 4 mmol/L (ref 3.5–5.1)
SODIUM: 131 mmol/L — AB (ref 135–145)

## 2016-09-09 LAB — CBC
HCT: 41.8 % (ref 39.0–52.0)
HEMOGLOBIN: 14.4 g/dL (ref 13.0–17.0)
MCH: 29.7 pg (ref 26.0–34.0)
MCHC: 34.4 g/dL (ref 30.0–36.0)
MCV: 86.2 fL (ref 78.0–100.0)
Platelets: 332 10*3/uL (ref 150–400)
RBC: 4.85 MIL/uL (ref 4.22–5.81)
RDW: 12.1 % (ref 11.5–15.5)
WBC: 11.6 10*3/uL — AB (ref 4.0–10.5)

## 2016-09-09 LAB — I-STAT TROPONIN, ED: TROPONIN I, POC: 0 ng/mL (ref 0.00–0.08)

## 2016-09-09 NOTE — ED Provider Notes (Signed)
[facility]-EMERGENCY DEPT Provider Note   CSN: [id number] Arrival date & time: 09/09/16  1412     History   Chief Complaint Chief Complaint  Patient presents with  . Chest Pain    HPI Kenneth James is a 26 y.o. male.  HPI  Chest pain worse with movements, standing, deep breaths Started on Friday after work out, did run and [employer] and today worse Not the first time, had similar symptoms last year, did not seek treatment, thought it was from caffeine pills at the time  No shortness of breath, No nausea or vomiting. No fevers. Feels like pain worse with movements but it feels like it is inside.  7/10, feels like a burning pain  25yrs ago quit cigarettes No family hx of early CAD No drug use No exertional symptoms  No hx of DVT/PE  1wk 5 hour drive, no other recent surgery, immobilization, no estrogen use, no hx of cancer, no leg pain or swelling, no hemoptysis  Past Medical History:  Diagnosis Date  . Diabetes mellitus without complication [facility])     Patient Active Problem List   Diagnosis Date Noted  . Wellness examination 08/17/2016  . Religious or spiritual beliefs affecting medical care 12/03/2015  . Type 1 diabetes mellitus (HCC) 12/18/2012    History reviewed. No pertinent surgical history.     Home Medications    Prior to Admission medications   Medication Sig Start Date End Date Taking? Authorizing Provider  insulin NPH-regular Human (HUMULIN 70/30) (70-30) 100 UNIT/ML injection 30 units with breakfast, and 20 units with the evening meal, and syringes 2/day 04/25/16  Yes Romero Belling, MD  losartan (COZAAR) 25 MG tablet Take 1 tablet (25 mg total) by mouth daily. Patient not taking: Reported on 09/09/2016 03/02/16   Romero Belling, MD  ONE TOUCH ULTRA TEST test strip TEST 7 TIMES PER DAY, AND LANCETS 7/DAY Patient not taking: Reported on 09/09/2016 07/24/16   Romero Belling, MD    Family History Family History  Problem Relation Age of Onset  .  Diabetes Other   . Diabetes Sister     Social History Social History  Substance Use Topics  . Smoking status: Former Smoker    Packs/day: 1.00    Years: 5.00    Types: Cigarettes    Quit date: 05/25/2014  . Smokeless tobacco: Never Used  . Alcohol use No     Comment: occ     Allergies   Patient has no known allergies.   Review of Systems Review of Systems  Constitutional: Negative for diaphoresis and fever.  HENT: Negative for sore throat.   Eyes: Negative for visual disturbance.  Respiratory: Negative for cough and shortness of breath.   Cardiovascular: Positive for chest pain. Negative for leg swelling.  Gastrointestinal: Negative for abdominal pain, nausea and vomiting.  Genitourinary: Negative for difficulty urinating.  Musculoskeletal: Negative for back pain and neck stiffness.  Skin: Negative for rash.  Neurological: Negative for syncope and headaches.     Physical Exam Updated Vital Signs BP 119/66   Pulse 83   Temp 97.7 F (36.5 C) (Oral)   Resp 14   Wt 189 lb (85.7 kg)   SpO2 98%   BMI 28.74 kg/m   Physical Exam  Constitutional: He is oriented to person, place, and time. He appears well-developed and well-nourished. No distress.  HENT:  Head: Normocephalic and atraumatic.  Eyes: Conjunctivae and EOM are normal.  Neck: Normal range of motion.  Cardiovascular: Normal rate,  regular rhythm, normal heart sounds and intact distal pulses.  Exam reveals no gallop and no friction rub.   No murmur heard. Pulmonary/Chest: Effort normal and breath sounds normal. No respiratory distress. He has no wheezes. He has no rales.  Abdominal: Soft. He exhibits no distension. There is no tenderness. There is no guarding.  Musculoskeletal: He exhibits no edema.  Neurological: He is alert and oriented to person, place, and time.  Skin: Skin is warm and dry. He is not diaphoretic.  Nursing note and vitals reviewed.    ED Treatments / Results  Labs (all labs ordered  are listed, but only abnormal results are displayed) Labs Reviewed  BASIC METABOLIC PANEL - Abnormal; Notable for the following:       Result Value   Sodium 131 (*)    Chloride 99 (*)    Glucose, Bld 222 (*)    Calcium 8.7 (*)    All other components within normal limits  CBC - Abnormal; Notable for the following:    WBC 11.6 (*)    All other components within normal limits  I-STAT TROPOININ, ED    EKG  EKG Interpretation  Date/Time:  Sunday September 09 2016 14:18:08 EDT Ventricular Rate:  95 PR Interval:  154 QRS Duration: 88 QT Interval:  344 QTC Calculation: 432 R Axis:   107 Text Interpretation:  Normal sinus rhythm Rightward axis Borderline ECG No significant change since last tracing Confirmed by Elexia Friedt MD, Tyonna Talerico ([id number]) on 09/09/2016 2:55:41 PM       Radiology Dg Chest 2 View  Result Date: 09/09/2016 CLINICAL DATA:  Chest pain 2 days worse with deep inspiration. EXAM: CHEST  2 VIEW COMPARISON:  03/03/2014 and 03/23/2013 FINDINGS: Lungs are adequately inflated without focal consolidation or effusion. Cardiomediastinal silhouette and remainder of the exam is unchanged. IMPRESSION: No active cardiopulmonary disease. Electronically Signed   By: Daniel  Boyle M.D.   On: 09/09/2016 15:05    Procedures Procedures (including critical care time)  Medications Ordered in ED Medications - No data to display   Initial Impression / Assessment and Plan / ED Course  I have reviewed the triage vital signs and the nursing notes.  Pertinent labs & imaging results that were available during my care of the patient were reviewed by me and considered in my medical decision making (see chart for details).     25  year old male with history of DM type 1 presents with concern for chest pain. Differential diagnosis for chest pain includes pulmonary embolus, dissection, pneumothorax, pneumonia, ACS, myocarditis, pericarditis.  EKG was done and evaluate by me and showed no acute ST changes  and no signs of pericarditis and is unchanged from prior. Chest x-ray was done and evaluated by me and radiology and showed no sign of pneumonia or pneumothorax, and a normal mediastinum. Normal and equal bilateral blood pressures, normal pulses, doubt dissection. Patient is PERC negative and low risk [place] and have low clinical suspicion for PE.  Patient is low risk HEAR score with negative troponins after continuous symptoms for 2 days. Given this evaluation, history and physical have low suspicion for pulmonary embolus, pneumonia, ACS, myocarditis, pericarditis, dissection.   Patient most likely with muscular chest pain given pain with movements, history of intense work out including bench press prior to symptoms developing. Recommend ibuprofen/tylenol. Return for worsening symptoms, otherwise recommend PCP follow up. Patient discharged in stable condition with understanding of reasons to return and recommend PCP follow up.   Final Clinical Impressions(s) / ED  Diagnoses   Final diagnoses:  Chest pain, unspecified type  Musculoskeletal pain    New Prescriptions Discharge Medication List as of 09/09/2016  4:59 PM       Alvira MondayErin Ailine Hefferan, MD 09/09/16 [id number]

## 2016-09-09 NOTE — ED Triage Notes (Addendum)
Pt to ED with c/o chest pain that started Friday while working out-- midsternal , does not hurt with palpatation, pt states it is getting worse- unable to sleep due to pain.  Pain worse with deep breath Pt also is type 1 diabetic.

## 2016-09-09 NOTE — ED Notes (Signed)
ED Provider at bedside. 

## 2016-09-09 NOTE — ED Notes (Signed)
Dr. Dalene SeltzerSchlossman reviewed results and discharge instructions with patient and he has no more questions. Reviewed papers again with him

## 2016-09-09 NOTE — ED Notes (Signed)
Pt states he has chest pain that started Friday after working out. He states that the pain is exasperated upon deep inhalation. Pt describes the pain as "that burn from sore muscles". Pt states that it does not hurt when he presses around the area but it hurts when he moves around.

## 2016-09-11 ENCOUNTER — Encounter: Payer: Self-pay | Admitting: Cardiology

## 2016-09-11 ENCOUNTER — Encounter: Payer: Self-pay | Admitting: *Deleted

## 2016-09-11 ENCOUNTER — Ambulatory Visit (INDEPENDENT_AMBULATORY_CARE_PROVIDER_SITE_OTHER): Payer: PPO | Admitting: Cardiology

## 2016-09-11 VITALS — BP 110/72 | HR 85 | Ht 68.0 in | Wt 197.2 lb

## 2016-09-11 DIAGNOSIS — R079 Chest pain, unspecified: Secondary | ICD-10-CM

## 2016-09-11 MED ORDER — IBUPROFEN 600 MG PO TABS
600.0000 mg | ORAL_TABLET | Freq: Three times a day (TID) | ORAL | 0 refills | Status: DC
Start: 2016-09-11 — End: 2016-11-05

## 2016-09-11 MED ORDER — OMEPRAZOLE 20 MG PO CPDR: 20.0000 mg | DELAYED_RELEASE_CAPSULE | Freq: Every day | ORAL | 0 refills | Status: DC

## 2016-09-11 NOTE — Progress Notes (Signed)
Cardiology Office Note   Date:  09/11/2016   ID:  Kenneth James, DOB 01-08-1991, MRN [id number]  Referring Doctor:  No PCP Per Patient   Cardiologist:   Almond LintAileen Elania Crowl, MD   Reason for consultation:  Chief Complaint  Patient presents with  . other    Self Ref for chest pain, pt. was working out last Friday and at rest, he had chest pain, if  taking a deep breath, his chest hurts as well.  The patient [facility] with chest pain on 09/09/2016.  Meds reviewed by the pt. verbally.       History of Present Illness: Kenneth James is a 26 y.o. male who presents for Chest pain.  Patient has type 1 diabetes. He routinely and regularly exercises to help with diabetes control. 4 days ago, Friday, he was doing his usual exercise or work out. He then noticed chest pain. This was described as sharp and stabbing kind of pain in the center of the chest, moderate in intensity, continuous, made worse by taking a deep breath or lying down. Nonradiating. The pain got more intense the following Sunday and therefore he decided to go to the ER.  In the ER he was ruled out for ACS. His EKG showed sinus rhythm. Troponin was 0. He was not started on any medications.  Patient is here for further evaluation. He continues to have pain. The same as when it started, worse with taking deep breaths and lying down.  No shortness of breath, no passing out, no PND, orthopnea, edema. No palpitations.   ROS:  Please see the history of present illness. Aside from mentioned under HPI, all other systems are reviewed and negative.     Past Medical History:  Diagnosis Date  . Diabetes mellitus without complication (HCC)     History reviewed. No pertinent surgical history.   reports that he quit smoking about 2 years ago. His smoking use included Cigarettes. He has a 5.00 pack-year smoking history. He has never used smokeless tobacco. He reports that he does not drink alcohol or use drugs.   family  history includes Diabetes in his other and sister.   Outpatient Medications Prior to Visit  Medication Sig Dispense Refill  . insulin NPH-regular Human (HUMULIN 70/30) (70-30) 100 UNIT/ML injection 30 units with breakfast, and 20 units with the evening meal, and syringes 2/day 20 mL 11  . losartan (COZAAR) 25 MG tablet Take 1 tablet (25 mg total) by mouth daily. 30 tablet 11  . ONE TOUCH ULTRA TEST test strip TEST 7 TIMES PER DAY, AND LANCETS 7/DAY 150 each 2   No facility-administered medications prior to visit.      Allergies: Patient has no known allergies.    PHYSICAL EXAM: VS:  BP 110/72 (BP Location: Right Arm, Patient Position: Sitting, Cuff Size: Normal)   Pulse 85   Ht 5\' 8"  (1.727 m)   Wt 197 lb 4 oz (89.5 kg)   BMI 29.99 kg/m  , Body mass index is 29.99 kg/m. Wt Readings from Last 3 Encounters:  09/11/16 197 lb 4 oz (89.5 kg)  09/09/16 189 lb (85.7 kg)  08/17/16 201 lb (91.2 kg)    GENERAL:  well developed, well nourished, not in acute distress HEENT: normocephalic, pink conjunctivae, anicteric sclerae, no xanthelasma, normal dentition, oropharynx clear NECK:  no neck vein engorgement, JVP normal, no hepatojugular reflux, carotid upstroke brisk and symmetric, no bruit, no thyromegaly, no lymphadenopathy LUNGS:  good respiratory effort,  clear to auscultation bilaterally CV:  PMI not displaced, no thrills, no lifts, S1 and S2 within normal limits, no palpable S3 or S4, no murmurs, no rubs, no gallops ABD:  Soft, nontender, nondistended, normoactive bowel sounds, no abdominal aortic bruit, no hepatomegaly, no splenomegaly MS: nontender back, no kyphosis, no scoliosis, no joint deformities EXT:  2+ DP/PT pulses, no edema, no varicosities, no cyanosis, no clubbing SKIN: warm, nondiaphoretic, normal turgor, no ulcers NEUROPSYCH: alert, oriented to person, place, and time, sensory/motor grossly intact, normal mood, appropriate affect  Recent Labs: 08/17/2016: ALT 63; TSH  1.16 09/09/2016: BUN 10; Creatinine, Ser 0.84; Hemoglobin 14.4; Platelets 332; Potassium 4.0; Sodium 131   Lipid Panel    Component Value Date/Time   CHOL 114 08/17/2016 1455   TRIG 43.0 08/17/2016 1455   HDL 44.40 08/17/2016 1455   CHOLHDL 3 08/17/2016 1455   VLDL 8.6 08/17/2016 1455   LDLCALC 61 08/17/2016 1455     Other studies Reviewed:  EKG:  The ekg from 09/11/2016 was personally reviewed by me and it revealed sinus rhythm, 85 BPM  Additional studies/ records that were reviewed personally reviewed by me today include: None available   ASSESSMENT AND PLAN: Chest pain, non-anginal in character No ischemic EKG changes. Troponin was 0. Possibly related to pleurisy or some mild inflammation of the pericardium. No evidence of pericarditis on EKG. Recommend echocardiogram. Recommend trial with NSAIDs, ibuprofen 600 mg 3 times a day taken with meals, for 1-2 weeks. Should also take GI prophylaxis with Prilosec 20 mg by mouth daily. Advised to increase hydration especially when he is on NSAIDs. Follow-up in one month or sooner.   Current medicines are reviewed at length with the patient today.  The patient does not have concerns regarding medicines.  Labs/ tests ordered today include:  Orders Placed This Encounter  Procedures  . EKG 12-Lead    I had a lengthy and detailed discussion with the patient regarding diagnoses, prognosis, diagnostic options, treatment options , and side effects of medications.   I counseled the patient on importance of lifestyle modification including heart healthy diet, regular physical activity .   Disposition:   FU with Cardiology in one month or sooner  I spent at least 45 minutes with the patient today and more than 50% of the time was spent counseling the patient and coordinating care.      Signed, Almond Lint, MD  09/11/2016 11:14 AM    [facility]  This note was generated in part with voice recognition  software and I apologize for any typographical errors that were not detected and corrected.

## 2016-09-11 NOTE — Patient Instructions (Addendum)
Medication Instructions:  Your physician has recommended you make the following change in your medication:  1. START Prilosec 20 mg once daily for 2 weeks 2. START Ibuprofen 600 mg three times daily for 2 weeks (Make sure to take with a meal)   Testing/Procedures: Your physician has requested that you have an echocardiogram. Echocardiography is a painless test that uses sound waves to create images of your heart. It provides your doctor with information about the size and shape of your heart and how well your heart's chambers and valves are working. This procedure takes approximately one hour. There are no restrictions for this procedure.    Follow-Up: Your physician recommends that you schedule a follow-up appointment in: 1 month with Dr. Alvino ChapelIngal.  It was a pleasure seeing you today here in the office. Please do not hesitate to give us a call back if you have any further questions. [phone]  [place] CellarPamela A. RN, BSN     Echocardiogram An echocardiogram, or echocardiography, uses sound waves (ultrasound) to produce an image of your heart. The echocardiogram is simple, painless, obtained within a short period of time, and offers valuable information to your health care provider. The images from an echocardiogram can provide information such as:  Evidence of coronary artery disease (CAD).  Heart size.  Heart muscle function.  Heart valve function.  Aneurysm detection.  Evidence of a past heart attack.  Fluid buildup around the heart.  Heart muscle thickening.  Assess heart valve function. Tell a health care provider about:  Any allergies you have.  All medicines you are taking, including vitamins, herbs, eye drops, creams, and over-the-counter medicines.  Any problems you or family members have had with anesthetic medicines.  Any blood disorders you have.  Any surgeries you have had.  Any medical conditions you have.  Whether you are pregnant or may be pregnant. What  happens before the procedure? No special preparation is needed. Eat and drink normally. What happens during the procedure?  In order to produce an image of your heart, gel will be applied to your chest and a wand-like tool (transducer) will be moved over your chest. The gel will help transmit the sound waves from the transducer. The sound waves will harmlessly bounce off your heart to allow the heart images to be captured in real-time motion. These images will then be recorded.  You may need an IV to receive a medicine that improves the quality of the pictures. What happens after the procedure? You may return to your normal schedule including diet, activities, and medicines, unless your health care provider tells you otherwise. This information is not intended to replace advice given to you by your health care provider. Make sure you discuss any questions you have with your health care provider. Document Released: 06/08/2000 Document Revised: 01/28/2016 Document Reviewed: 02/16/2013 Elsevier Interactive Patient Education  2017 [employer]

## 2016-09-28 ENCOUNTER — Telehealth: Payer: Self-pay | Admitting: Endocrinology

## 2016-09-28 NOTE — Telephone Encounter (Signed)
Pt called in and said he needs some kind of letter saying he is a diabetic and that's why he has missed classes? Please advise.

## 2016-09-28 NOTE — Telephone Encounter (Signed)
Please advise. Thank you

## 2016-09-30 NOTE — Telephone Encounter (Signed)
done

## 2016-10-01 ENCOUNTER — Other Ambulatory Visit: Payer: Self-pay | Admitting: Cardiology

## 2016-10-01 DIAGNOSIS — R079 Chest pain, unspecified: Secondary | ICD-10-CM

## 2016-10-01 NOTE — Telephone Encounter (Signed)
Patient notified letter is ready for pick up. 

## 2016-10-05 ENCOUNTER — Other Ambulatory Visit: Payer: Self-pay | Admitting: Endocrinology

## 2016-10-11 ENCOUNTER — Encounter: Payer: Self-pay | Admitting: Cardiology

## 2016-10-11 ENCOUNTER — Telehealth: Payer: Self-pay | Admitting: *Deleted

## 2016-10-11 ENCOUNTER — Ambulatory Visit (INDEPENDENT_AMBULATORY_CARE_PROVIDER_SITE_OTHER): Payer: PPO

## 2016-10-11 ENCOUNTER — Other Ambulatory Visit: Payer: Self-pay

## 2016-10-11 DIAGNOSIS — R079 Chest pain, unspecified: Secondary | ICD-10-CM

## 2016-10-11 NOTE — Telephone Encounter (Signed)
No answer. No voicemail. 

## 2016-10-11 NOTE — Telephone Encounter (Signed)
-----   Message from Almond Lint, MD sent at 10/11/2016  4:31 PM EDT ----- lvef ok Right side of heart appears big on some views but heart function ok Recommend CTA chest and also limited echo with bubble study to rule out shunt We shd see him after tests

## 2016-10-12 NOTE — Telephone Encounter (Signed)
Spoke with scheduling and they are going to call and schedule testing with patient.

## 2016-10-12 NOTE — Telephone Encounter (Signed)
Spoke with patient and reviewed results and recommendations with him. Let him know that someone would be in touch to schedule these tests and then we would have him come in to see Dr. Alvino Chapel after testing has been done. He verbalized understanding, agreement with plan, and had no further questions at this time.

## 2016-10-15 ENCOUNTER — Telehealth: Payer: Self-pay | Admitting: Cardiology

## 2016-10-15 NOTE — Telephone Encounter (Signed)
Left message with scheduling.

## 2016-10-15 NOTE — Telephone Encounter (Signed)
Spoke with patient. He wants to reschedule his echo with bubble study to tomorrow if able and reschedule appt with Dr Alvino Chapel to be after the test. Appt r/s with Dr Alvino Chapel to a later date and message left with scheduling to r/s echo.

## 2016-10-15 NOTE — Telephone Encounter (Signed)
Spoke with Britta Mccreedy in scheduling. Cannot reschedule echo with bubble study for tomorrow. She said Thursday is the next open slot which is where patient is already scheduled.  Contacted patient to let him know. He verbalized understanding and will keep appt for Thursday.

## 2016-10-15 NOTE — Telephone Encounter (Signed)
Pt would like to get his echo bubble study moved from Thursday 4/26, to tomorrow 4/24 if possible. Please call.

## 2016-10-16 ENCOUNTER — Ambulatory Visit: Payer: PPO | Admitting: Cardiology

## 2016-10-18 ENCOUNTER — Ambulatory Visit
Admission: RE | Admit: 2016-10-18 | Discharge: 2016-10-18 | Disposition: A | Discharge status: Home / Self Care | Payer: PPO | Source: Ambulatory Visit | Attending: Cardiology | Admitting: Cardiology

## 2016-10-18 DIAGNOSIS — I517 Cardiomegaly: Secondary | ICD-10-CM | POA: Insufficient documentation

## 2016-10-18 DIAGNOSIS — R079 Chest pain, unspecified: Secondary | ICD-10-CM | POA: Insufficient documentation

## 2016-10-18 DIAGNOSIS — Q211 Atrial septal defect: Secondary | ICD-10-CM | POA: Insufficient documentation

## 2016-10-18 NOTE — Progress Notes (Signed)
*  PRELIMINARY RESULTS* Echocardiogram 2D Echocardiogram has been performed.  Kenneth James 10/18/2016, 10:48 AM

## 2016-10-19 ENCOUNTER — Telehealth: Payer: Self-pay | Admitting: *Deleted

## 2016-10-19 ENCOUNTER — Telehealth: Payer: Self-pay | Admitting: Cardiology

## 2016-10-19 NOTE — Telephone Encounter (Signed)
-----   Message from Almond Lint, MD sent at 10/18/2016  4:54 PM EDT ----- Need to bring him in

## 2016-10-19 NOTE — Telephone Encounter (Signed)
Pt would like echo bubble study result. Please call.

## 2016-10-19 NOTE — Telephone Encounter (Signed)
CT order and appointment canceled. Patient aware.

## 2016-10-19 NOTE — Telephone Encounter (Signed)
Test has not resulted yet.  Pt had test yesterday.

## 2016-10-19 NOTE — Telephone Encounter (Signed)
-----   Message from Almond Lint, MD sent at 10/18/2016 10:00 PM EDT ----- We shd cancel the CT we ordered for now. Thanks.

## 2016-10-19 NOTE — Telephone Encounter (Signed)
No answer. No voicemail. 

## 2016-10-19 NOTE — Telephone Encounter (Signed)
Spoke with patient and let him know that Dr. Alvino Chapel would like for him to come in to discuss results of echocardiogram. Also made him aware that she would like [place] to discontinue the CT that was ordered. He wanted results released to [facility] and let him know that they would release automatic and then he wanted to know what it showed. Let him know that Dr. Alvino Chapel wanted him to come in and discuss those results but that the pumping action was within normal limits. He then wanted to know if I was the nurse and I confirmed that I was Dr. Gearlean Alf nurse and her instructions were to have him come in to see her because she will go over the other findings. Patient coming in at 10/23/16 at 1:00 PM

## 2016-10-19 NOTE — Telephone Encounter (Signed)
Patient called back stating that he wanted the results of his test. Let him know that Dr. Alvino Chapel would review this with him and he continued to insist. Let him know that I could read the report but I am unable to interpret what those findings mean. Read study conclusions to patient and he wanted to know what that meant. Reminded him that I am unable to interpret these findings and that is why Dr. Alvino Chapel wanted him to come in so she could discuss these findings. He verbalized understanding and had no further questions at this time. Confirmed appointment for 10/23/16 at 1:000PM with Dr. Alvino Chapel.

## 2016-10-23 ENCOUNTER — Ambulatory Visit (INDEPENDENT_AMBULATORY_CARE_PROVIDER_SITE_OTHER): Payer: PPO | Admitting: Cardiology

## 2016-10-23 ENCOUNTER — Ambulatory Visit: Payer: PPO

## 2016-10-23 ENCOUNTER — Encounter: Payer: Self-pay | Admitting: Cardiology

## 2016-10-23 VITALS — BP 120/60 | HR 83 | Ht 67.0 in | Wt 196.5 lb

## 2016-10-23 DIAGNOSIS — Q211 Atrial septal defect, unspecified: Secondary | ICD-10-CM

## 2016-10-23 MED ORDER — ASPIRIN EC 81 MG PO TBEC: 81.0000 mg | DELAYED_RELEASE_TABLET | Freq: Every day | ORAL | 3 refills | Status: DC

## 2016-10-23 NOTE — Progress Notes (Signed)
Cardiology Office Note   Date:  10/23/2016   ID:  Kenneth James, DOB [birth year], MRN [id number]  Referring Doctor:  No PCP Per Patient   Cardiologist:   Almond Lint, MD   Reason for consultation:  Chief Complaint  Patient presents with  . other    1 month follow up after testing. Patient denies chest pain and SOB. Meds reviewed verbally with patient.       History of Present Illness: Kenneth James is a 26 y.o. male who presents for Follow-up after testing  Review records Patient has type 1 diabetes. He routinely and regularly exercises to help with diabetes control. 4 days ago, Friday, he was doing his usual exercise or work out. He then noticed chest pain. This was described as sharp and stabbing kind of pain in the center of the chest, moderate in intensity, continuous, made worse by taking a deep breath or lying down. Nonradiating. The pain got more intense the following Sunday and therefore he decided to go to the ER. In the ER he was ruled out for ACS. His EKG showed sinus rhythm. Troponin was 0. He was not started on any medications.  The echo was done to rule out pericardial effusion/pericarditis. Incidentally, it revealed possible ASD. He is here to discuss that.  No recurrence of chest pain. No shortness of breath. He exercises a lot to help control his type 1 diabetes.   ROS:  Please see the history of present illness. Aside from mentioned under HPI, all other systems are reviewed and negative.    Past Medical History:  Diagnosis Date  . Diabetes mellitus without complication (HCC)     History reviewed. No pertinent surgical history.   reports that he quit smoking about 2 years ago. His smoking use included Cigarettes. He has a 5.00 pack-year smoking history. He has never used smokeless tobacco. He reports that he does not drink alcohol or use drugs.   family history includes Diabetes in his other and sister.   Outpatient Medications Prior to Visit    Medication Sig Dispense Refill  . ibuprofen (ADVIL,MOTRIN) 600 MG tablet Take 1 tablet (600 mg total) by mouth 3 (three) times daily. 42 tablet 0  . insulin NPH-regular Human (HUMULIN 70/30) (70-30) 100 UNIT/ML injection 30 units with breakfast, and 20 units with the evening meal, and syringes 2/day 20 mL 11  . losartan (COZAAR) 25 MG tablet Take 1 tablet (25 mg total) by mouth daily. 30 tablet 11  . omeprazole (PRILOSEC) 20 MG capsule Take 1 capsule (20 mg total) by mouth daily. 14 capsule 0  . ONE TOUCH ULTRA TEST test strip TEST 7 TIMES PER DAY, AND LANCETS 7/DAY 150 each 2  . ONE TOUCH ULTRA TEST test strip TEST 7 TIMES PER DAY, AND LANCETS 7/DAY 150 each 2   No facility-administered medications prior to visit.      Allergies: Patient has no known allergies.    PHYSICAL EXAM: VS:  BP 120/60 (BP Location: Left Arm, Patient Position: Sitting, Cuff Size: Normal)   Pulse 83   Ht  (1.702 m)   Wt 196 lb 8 oz (89.1 kg)   SpO2 98%   BMI 30.78 kg/m  , Body mass index is 30.78 kg/m. Wt Readings from Last 3 Encounters:  10/23/16 196 lb 8 oz (89.1 kg)  09/11/16 197 lb 4 oz (89.5 kg)  09/09/16 189 lb (85.7 kg)    GENERAL:  well developed, well nourished, obese, not in acute  distress HEENT: normocephalic, pink conjunctivae, anicteric sclerae, no xanthelasma, normal dentition, oropharynx clear NECK:  no neck vein engorgement, JVP normal, no hepatojugular reflux, carotid upstroke brisk and symmetric, no bruit, no thyromegaly, no lymphadenopathy LUNGS:  good respiratory effort, clear to auscultation bilaterally CV:  PMI not displaced, no thrills, no lifts, S1 and S2 within normal limits, no palpable S3 or S4, no murmurs, no rubs, no gallops ABD:  Soft, nontender, nondistended, normoactive bowel sounds, no abdominal aortic bruit, no hepatomegaly, no splenomegaly MS: nontender back, no kyphosis, no scoliosis, no joint deformities EXT:  2+ DP/PT pulses, no edema, no varicosities, no  cyanosis, no clubbing SKIN: warm, nondiaphoretic, normal turgor, no ulcers NEUROPSYCH: alert, oriented to person, place, and time, sensory/motor grossly intact, normal mood, appropriate affect    Recent Labs: 08/17/2016: ALT 63; TSH 1.16 09/09/2016: BUN 10; Creatinine, Ser 0.84; Hemoglobin 14.4; Platelets 332; Potassium 4.0; Sodium 131   Lipid Panel    Component Value Date/Time   CHOL 114 08/17/2016 1455   TRIG 43.0 08/17/2016 1455   HDL 44.40 08/17/2016 1455   CHOLHDL 3 08/17/2016 1455   VLDL 8.6 08/17/2016 1455   LDLCALC 61 08/17/2016 1455     Other studies Reviewed:  EKG:  The ekg from 09/11/2016 was personally reviewed by me and it revealed sinus rhythm, 85 BPM  Additional studies/ records that were reviewed personally reviewed by me today include: Echo 10/18/2016: Left ventricle: The cavity size was normal. Wall thickness was   normal. Systolic function was normal. The estimated ejection   fraction was in the range of 60% to 65%. Wall motion was normal;   there were no regional wall motion abnormalities. - Right ventricle: The cavity size was mildly dilated. Wall   thickness was normal. Systolic function was moderately reduced. - Right atrium: The atrium was mildly dilated. - Atrial septum: There was a probable, large secundum atrial septal   defect. There was a large bidirectional, but predominantly   left-to-right, shunt through an atrial septal defect. Echo   contrast study showed a small right-to-left atrial level shunt,   in the baseline state. - Pulmonary arteries: Systolic pressure could not be accurately   estimated.   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 Patient is graduating this Friday and is bound to go home to [place] by July or August. He would still like to speak with interventional  cardiologist about ASD closure. We'll hold off on TEE for now. We could do here in [place] or [place] with 3-D probe if patient decides to pursue treatment here. He also has an option of treatment back in [place]. Discussed need for antibiotic prophylaxis prior to dental procedure. Avoid straining/heavy lifting, scuba divig.   Chest pain, non-anginal in character No ischemic EKG changes. Troponin was 0.   Current medicines are reviewed at length with the patient today.  The patient does not have concerns regarding medicines.  Labs/ tests ordered today include:  Orders Placed This Encounter  Procedures  . Ambulatory referral to Cardiology     I spent at least 40 minutes with the patient today and more than 50% of the time was spent counseling the patient and coordinating care.      Signed, Almond Lint, MD  10/23/2016 2:04 PM    [facility]  This note was generated in part with  voice recognition software and I apologize for any typographical errors that were not detected and corrected.

## 2016-10-23 NOTE — Patient Instructions (Signed)
Follow-Up: We will have someone give you a call to schedule with Dr. Excell Seltzer.  It was a pleasure seeing you today here in the office. Please do not hesitate to give [place] a call back if you have any further questions. [phone]  [place] Cellar RN, BSN

## 2016-10-25 ENCOUNTER — Ambulatory Visit: Payer: PPO | Admitting: Cardiology

## 2016-10-30 ENCOUNTER — Ambulatory Visit (INDEPENDENT_AMBULATORY_CARE_PROVIDER_SITE_OTHER): Payer: PPO | Admitting: Endocrinology

## 2016-10-30 ENCOUNTER — Encounter: Payer: Self-pay | Admitting: Endocrinology

## 2016-10-30 VITALS — BP 126/78 | HR 79 | Ht 67.0 in | Wt 196.0 lb

## 2016-10-30 DIAGNOSIS — E109 Type 1 diabetes mellitus without complications: Secondary | ICD-10-CM | POA: Diagnosis not present

## 2016-10-30 DIAGNOSIS — K769 Liver disease, unspecified: Secondary | ICD-10-CM

## 2016-10-30 LAB — HEPATIC FUNCTION PANEL
ALBUMIN: 4.5 g/dL (ref 3.5–5.2)
ALT: 15 U/L (ref 0–53)
AST: 22 U/L (ref 0–37)
Alkaline Phosphatase: 83 U/L (ref 39–117)
BILIRUBIN TOTAL: 0.4 mg/dL (ref 0.2–1.2)
Bilirubin, Direct: 0.2 mg/dL (ref 0.0–0.3)
Total Protein: 7.3 g/dL (ref 6.0–8.3)

## 2016-10-30 LAB — POCT GLYCOSYLATED HEMOGLOBIN (HGB A1C): HEMOGLOBIN A1C: 6.4

## 2016-10-30 MED ORDER — INSULIN NPH ISOPHANE & REGULAR (70-30) 100 UNIT/ML ~~LOC~~ SUSP: SUBCUTANEOUS | 11 refills | Status: DC

## 2016-10-30 NOTE — Progress Notes (Signed)
Subjective:    Patient ID: Kenneth James, male    DOB: 04-Jul-1990, 26 y.o.   MRN: [id number]  HPI Pt returns for f/u of diabetes mellitus: DM type: 1 Dx'ed: 1999 Complications: none.   Therapy: insulin since dx.  DKA: only at dx.  Severe hypoglycemia: several episodes (most recently twice in 2017).   Pancreatitis: never.  Other: he changed to bid premixed insulin, after poor results with multiple daily injections; he declines pump rx; he is a Physicist, medical.   Interval history: He has mild hypoglycemia approx once per week.  This happens in the afternoon, when he is active.  no cbg record, but states cbg's are usually higher fasting than later in the day.  He will move back to [place] soon.  He says in the past, weight is constant over the Ramadan month.   Past Medical History:  Diagnosis Date  . Diabetes mellitus without complication (HCC)     No past surgical history on file.  Social History   Social History  . Marital status: Single    Spouse name: N/A  . Number of children: N/A  . Years of education: N/A   Occupational History  . Not on file.   Social History Main Topics  . Smoking status: Former Smoker    Packs/day: 1.00    Years: 5.00    Types: Cigarettes    Quit date: 05/25/2014  . Smokeless tobacco: Never Used  . Alcohol use No  . Drug use: No  . Sexual activity: Not on file   Other Topics Concern  . Not on file   Social History Narrative  . No narrative on file    Current Outpatient Prescriptions on File Prior to Visit  Medication Sig Dispense Refill  . ibuprofen (ADVIL,MOTRIN) 600 MG tablet Take 1 tablet (600 mg total) by mouth 3 (three) times daily. 42 tablet 0  . losartan (COZAAR) 25 MG tablet Take 1 tablet (25 mg total) by mouth daily. 30 tablet 11  . omeprazole (PRILOSEC) 20 MG capsule Take 1 capsule (20 mg total) by mouth daily. 14 capsule 0  . ONE TOUCH ULTRA TEST test strip TEST 7 TIMES PER DAY, AND LANCETS 7/DAY 150 each 2  .  ONE TOUCH ULTRA TEST test strip TEST 7 TIMES PER DAY, AND LANCETS 7/DAY 150 each 2   No current facility-administered medications on file prior to visit.     No Known Allergies  Family History  Problem Relation Age of Onset  . Diabetes Other   . Diabetes Sister     BP 126/78   Pulse 79   Ht 5\' 7"  (1.702 m)   Wt 196 lb (88.9 kg)   SpO2 98%   BMI 30.70 kg/m    Review of Systems Denies LOC    Objective:   Physical Exam VITAL SIGNS:  See vs page GENERAL: no distress Pulses: dorsalis pedis intact bilat.   MSK: no deformity of the feet CV: no leg edema Skin:  no ulcer on the feet.  normal color and temp on the feet. Neuro: sensation is intact to touch on the feet.    a1c=6.4%     Assessment & Plan:  Type 1 DM: overcontrolled, given this regimen, which does match insulin to his changing needs throughout the day.   Patient Instructions  Please take 28 units with breakfast (18 if you are going to to exercise), and 22 units with the evening meal.  On this type of insulin schedule,  you should eat meals on a regular schedule.  If a meal is missed or significantly delayed, your blood sugar could go low.   For the upcoming Ramadan month, start taking 40 units with supper and 5 units with breakfast.   check your blood sugar 4 times a day.  vary the time of day when you check, between before the 3 meals, and at bedtime.  also check if you have symptoms of your blood sugar being too high or too low.  please keep a record of the readings and bring it to your next appointment here (or you can bring the meter itself).  You can write it on any piece of paper.  please call us sooner if your blood sugar goes below 70, or if you have a lot of readings over 200.  blood tests are requested for you today.  We'll let you know about the results.   Please come back for a follow-up appointment in 3-4 months, if you still live here.  If not, best wishes on your upcoming.  move.

## 2016-10-30 NOTE — Patient Instructions (Addendum)
Please take 28 units with breakfast (18 if you are going to to exercise), and 22 units with the evening meal.  On this type of insulin schedule, you should eat meals on a regular schedule.  If a meal is missed or significantly delayed, your blood sugar could go low.   For the upcoming Ramadan month, start taking 40 units with supper and 5 units with breakfast.   check your blood sugar 4 times a day.  vary the time of day when you check, between before the 3 meals, and at bedtime.  also check if you have symptoms of your blood sugar being too high or too low.  please keep a record of the readings and bring it to your next appointment here (or you can bring the meter itself).  You can write it on any piece of paper.  please call us sooner if your blood sugar goes below 70, or if you have a lot of readings over 200.  blood tests are requested for you today.  We'll let you know about the results.   Please come back for a follow-up appointment in 3-4 months, if you still live here.  If not, best wishes on your upcoming.  move.

## 2016-10-31 LAB — HEPATITIS C ANTIBODY: HCV AB: NEGATIVE

## 2016-10-31 LAB — HEPATITIS B SURFACE ANTIGEN: HEP B S AG: NEGATIVE

## 2016-11-01 ENCOUNTER — Encounter: Payer: Self-pay | Admitting: Cardiovascular Disease

## 2016-11-05 ENCOUNTER — Ambulatory Visit (INDEPENDENT_AMBULATORY_CARE_PROVIDER_SITE_OTHER): Payer: PPO | Admitting: Cardiovascular Disease

## 2016-11-05 ENCOUNTER — Encounter: Payer: Self-pay | Admitting: Cardiovascular Disease

## 2016-11-05 VITALS — BP 114/70 | HR 90 | Ht 67.0 in | Wt 197.0 lb

## 2016-11-05 DIAGNOSIS — Q211 Atrial septal defect, unspecified: Secondary | ICD-10-CM

## 2016-11-05 MED ORDER — ASPIRIN EC 81 MG PO TBEC: 81.0000 mg | DELAYED_RELEASE_TABLET | Freq: Every day | ORAL | Status: AC

## 2016-11-05 NOTE — Patient Instructions (Signed)
Medication Instructions:  Your physician has recommended you make the following change in your medication:  1. START Aspirin 81mg  take one tablet by mouth daily  Labwork: No new orders.   Testing/Procedures: Your physician has requested that you have a TEE. During a TEE, sound waves are used to create images of your heart. It provides your doctor with information about the size and shape of your heart and how well your heart's chambers and valves are working. In this test, a transducer is attached to the end of a flexible tube that's guided down your throat and into your esophagus (the tube leading from you mouth to your stomach) to get a more detailed image of your heart. You are not awake for the procedure. Please see the instruction sheet given to you today. For further information please visit [email or web address].  Follow-Up: We will arrange further evaluation after TEE.   Any Other Special Instructions Will Be Listed Below (If Applicable).     If you need a refill on your cardiac medications before your next appointment, please call your pharmacy.

## 2016-11-05 NOTE — Progress Notes (Signed)
Cardiology Office Note Date:  11/05/2016   ID:  Kenneth James, DOB [birth year], MRN [id number]  PCP:  Patient, No Pcp Per  Cardiologist:  Dr Alvino Chapel  Chief Complaint  Patient presents with  . New Patient (Initial Visit)    ASD consult     History of Present Illness: Kenneth James is a 26 y.o. male who presents for Evaluation of an atrial septal defect. He is referred by Dr. Alvino Chapel for consideration of treatment options.  The patient is here alone today. He is originally from [place] and plans to return in a few months. He has been here for college. The patient was evaluated for atypical chest pain. He underwent an echocardiogram which incidentally demonstrated a dilated right ventricle and right atrium. He underwent a repeat study with gated saline which identified an atrial septal defect primarily with left to right shunting but also with bidirectional flow and positive bubble study.  The patient has no previous history of heart disease. He reports that his parents recently told him he had some trouble breathing when he was 26 years old and was told he had asthma at that time. He has not required any specific treatments for asthma. He's had no shortness of breath as a teenager or an adult. He is physically active and exercises regularly without exertional symptoms. Today, he denies symptoms of palpitations, chest pain, shortness of breath, orthopnea, PND, lower extremity edema, dizziness, or syncope.  He's never been hospitalized except for a few episodes of hypoglycemia. He was diagnosed with type 1 diabetes at age 32. He's had no other medical problems. States that his diabetes has been well controlled.  The patient's paternal aunt underwent surgical ASD closure at age 25. She is now 25 years old and doing well.  Past Medical History:  Diagnosis Date  . Diabetes mellitus without complication (HCC)     History reviewed. No pertinent surgical history.  Current Outpatient  Prescriptions  Medication Sig Dispense Refill  . insulin NPH-regular Human (HUMULIN 70/30) (70-30) 100 UNIT/ML injection 28 units with breakfast, and 22 units with the evening meal, and syringes 2/day 20 mL 11  . losartan (COZAAR) 25 MG tablet Take 1 tablet (25 mg total) by mouth daily. 30 tablet 11  . ONE TOUCH ULTRA TEST test strip TEST 7 TIMES PER DAY, AND LANCETS 7/DAY 150 each 2  . aspirin EC 81 MG tablet Take 1 tablet (81 mg total) by mouth daily.     No current facility-administered medications for this visit.     Allergies:   Patient has no known allergies.   Social History:  The patient  reports that he quit smoking about 2 years ago. His smoking use included Cigarettes. He has a 5.00 pack-year smoking history. He has never used smokeless tobacco. He reports that he does not drink alcohol or use drugs.   Family History:  The patient's  family history includes Diabetes in his other, paternal grandfather, and sister.  ASD in paternal aunt  ROS:  Please see the history of present illness.   All other systems are reviewed and negative.    PHYSICAL EXAM: VS:  BP 114/70   Pulse 90   Ht 5\' 7"  (1.702 m)   Wt 197 lb (89.4 kg)   BMI 30.85 kg/m  , BMI Body mass index is 30.85 kg/m. GEN: Well nourished, well developed, in no acute distress  HEENT: normal  Neck: no JVD, no masses. No carotid bruits Cardiac: RRR without murmur or  gallop                Respiratory:  clear to auscultation bilaterally, normal work of breathing GI: soft, nontender, nondistended, + BS MS: no deformity or atrophy  Ext: no pretibial edema, pedal pulses 2+= bilaterally Skin: warm and dry, no rash Neuro:  Strength and sensation are intact Psych: euthymic mood, full affect  EKG:  EKG is not ordered today. The ekgs from 08/17/2016 and 09/09/2016 demonstrated normal sinus rhythm with right axis deviation.  Recent Labs: 08/17/2016: TSH 1.16 09/09/2016: BUN 10; Creatinine, Ser 0.84; Hemoglobin 14.4; Platelets  332; Potassium 4.0; Sodium 131 10/30/2016: ALT 15   Lipid Panel     Component Value Date/Time   CHOL 114 08/17/2016 1455   TRIG 43.0 08/17/2016 1455   HDL 44.40 08/17/2016 1455   CHOLHDL 3 08/17/2016 1455   VLDL 8.6 08/17/2016 1455   LDLCALC 61 08/17/2016 1455      Wt Readings from Last 3 Encounters:  11/05/16 197 lb (89.4 kg)  10/30/16 196 lb (88.9 kg)  10/23/16 196 lb 8 oz (89.1 kg)     Cardiac Studies Reviewed: 2D Echo 10-18-2016: Study Conclusions  - Left ventricle: The cavity size was normal. Wall thickness was   normal. Systolic function was normal. The estimated ejection   fraction was in the range of 60% to 65%. Wall motion was normal;   there were no regional wall motion abnormalities. - Right ventricle: The cavity size was mildly dilated. Wall   thickness was normal. Systolic function was moderately reduced. - Right atrium: The atrium was mildly dilated. - Atrial septum: There was a probable, large secundum atrial septal   defect. There was a large bidirectional, but predominantly   left-to-right, shunt through an atrial septal defect. Echo   contrast study showed a small right-to-left atrial level shunt,   in the baseline state. - Pulmonary arteries: Systolic pressure could not be accurately   estimated.  Echo 10-11-2016: Left ventricle:  The cavity size was normal. Wall thickness was normal. Systolic function was normal. The estimated ejection fraction was in the range of 55% to 60%. Wall motion was normal; there were no regional wall motion abnormalities. Left ventricular diastolic function parameters were normal.  ------------------------------------------------------------------- Aortic valve:   Trileaflet.  Doppler:   There was no stenosis. There was no regurgitation.    VTI ratio of LVOT to aortic valve: 0.88. Valve area (VTI): 2.24 cm^2. Indexed valve area (VTI): 1.07 cm^2/m^2. Mean velocity ratio of LVOT to aortic valve: 0.86. Valve area (Vmean):  2.19 cm^2. Indexed valve area (Vmean): 1.05 cm^2/m^2.    Mean gradient (S): 3 mm Hg.  ------------------------------------------------------------------- Aorta:  Aortic root: The aortic root was normal in size. Ascending aorta: The ascending aorta was normal in size.  ------------------------------------------------------------------- Mitral valve:   Structurally normal valve.   Leaflet separation was normal.  Doppler:  Transvalvular velocity was within the normal range. There was no evidence for stenosis. There was no significant regurgitation.    Valve area by pressure half-time: 3.73 cm^2. Indexed valve area by pressure half-time: 1.78 cm^2/m^2.    Peak gradient (D): 3 mm Hg.  ------------------------------------------------------------------- Left atrium:  The atrium was normal in size.  ------------------------------------------------------------------- Right ventricle:  The cavity size was mildly dilated. Systolic function was normal.  ------------------------------------------------------------------- Pulmonic valve:   Poorly visualized.  Doppler:  There was no significant regurgitation.  ------------------------------------------------------------------- Tricuspid valve:   Structurally normal valve.   Leaflet separation was normal.  Doppler:  Transvalvular  velocity was within the normal range. There was mild-moderate regurgitation.  ------------------------------------------------------------------- Pulmonary artery:   Systolic pressure was within the normal range.   ------------------------------------------------------------------- Right atrium:  The atrium was mildly dilated.  ------------------------------------------------------------------- Pericardium:  There was no pericardial effusion.  ------------------------------------------------------------------- Systemic veins: Inferior vena cava: The vessel was normal in size. The respirophasic diameter  changes were in the normal range (>= 50%), consistent with normal central venous pressure.  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 The patient appears to be asymptomatic. His only significant comorbid medical condition is type 1 diabetes which appears to be well-controlled.  We discussed the natural history of atrial septal defect today. The patient's echo studies demonstrate dilatation of the right ventricle and right atrium, suggestive of a hemodynamically significant left to right shunt. We discussed consideration of ASD closure and reviewed the options of surgical closure versus transcatheter closure. In order to evaluate the patient's interatrial septal anatomy better, a TEE is necessary. I have reviewed the risks, indications, and alternatives to TEE evaluation. This will be scheduled tomorrow with Dr. Jens Somrenshaw has an outpatient. Pending that evaluation, the patient may be a candidate for percutaneous transcatheter ASD closure which is clearly indicated in the context of right heart dilatation in order to prevent complications of pulmonary hypertension and right-sided congestive heart failure in the future.   If his anatomy is favorable, we reviewed the details of transcatheter ASD closure. I reviewed the risks, indications, and alternatives to transcatheter closure with the patient. Specific risks include bleeding, infection, device embolization, stroke, cardiac perforation, tamponade, arrhythmia, MI, and late device erosion. He understands these serious risks occur at low incidence of < 1%. I will FU with him by telephone after his TEE to further discuss options.   Current medicines are reviewed with the patient today.  The patient does not have concerns regarding medicines.  Labs/ tests ordered today include:  No orders of the defined types were placed in this encounter.  Disposition:   ASD closure pending TEE evaluation  Signed, Tonny Bollmanooper, Peggy Loge, MD    11/05/2016 11:57 AM    [facility] [address], [place], [place]  [address] Phone: [phone]; Fax: [phone]

## 2016-11-06 ENCOUNTER — Other Ambulatory Visit: Payer: Self-pay | Admitting: Cardiovascular Disease

## 2016-11-06 ENCOUNTER — Ambulatory Visit (HOSPITAL_BASED_OUTPATIENT_CLINIC_OR_DEPARTMENT_OTHER)
Admission: RE | Admit: 2016-11-06 | Discharge: 2016-11-06 | Disposition: A | Discharge status: Home / Self Care | Payer: PPO | Source: Ambulatory Visit | Attending: Cardiology | Admitting: Cardiology

## 2016-11-06 ENCOUNTER — Encounter (HOSPITAL_COMMUNITY): Payer: Self-pay | Admitting: *Deleted

## 2016-11-06 ENCOUNTER — Ambulatory Visit (HOSPITAL_COMMUNITY)
Admission: RE | Admit: 2016-11-06 | Discharge: 2016-11-06 | Disposition: A | Discharge status: Home / Self Care | Payer: PPO | Source: Ambulatory Visit | Attending: Cardiology | Admitting: Cardiology

## 2016-11-06 ENCOUNTER — Encounter (HOSPITAL_COMMUNITY)
Admission: RE | Disposition: A | Discharge status: Home / Self Care | Payer: Self-pay | Source: Ambulatory Visit | Attending: Cardiology

## 2016-11-06 DIAGNOSIS — E119 Type 2 diabetes mellitus without complications: Secondary | ICD-10-CM | POA: Insufficient documentation

## 2016-11-06 DIAGNOSIS — Q211 Atrial septal defect, unspecified: Secondary | ICD-10-CM

## 2016-11-06 DIAGNOSIS — R0789 Other chest pain: Secondary | ICD-10-CM | POA: Diagnosis not present

## 2016-11-06 DIAGNOSIS — Z7982 Long term (current) use of aspirin: Secondary | ICD-10-CM | POA: Insufficient documentation

## 2016-11-06 DIAGNOSIS — Z794 Long term (current) use of insulin: Secondary | ICD-10-CM | POA: Insufficient documentation

## 2016-11-06 DIAGNOSIS — Z87891 Personal history of nicotine dependence: Secondary | ICD-10-CM | POA: Diagnosis not present

## 2016-11-06 HISTORY — PX: TEE WITHOUT CARDIOVERSION: SHX5443

## 2016-11-06 LAB — GLUCOSE, CAPILLARY: GLUCOSE-CAPILLARY: 104 mg/dL — AB (ref 65–99)

## 2016-11-06 SURGERY — ECHOCARDIOGRAM, TRANSESOPHAGEAL
Anesthesia: Moderate Sedation

## 2016-11-06 MED ORDER — SODIUM CHLORIDE 0.9 % IV SOLN
INTRAVENOUS | Status: DC
  Administered 2016-11-06: 08:00:00 via INTRAVENOUS

## 2016-11-06 MED ORDER — BUTAMBEN-TETRACAINE-BENZOCAINE 2-2-14 % EX AERO
INHALATION_SPRAY | CUTANEOUS | Status: DC | PRN
  Administered 2016-11-06: 2 via TOPICAL

## 2016-11-06 MED ORDER — FENTANYL CITRATE (PF) 100 MCG/2ML IJ SOLN
INTRAMUSCULAR | Status: AC
  Filled 2016-11-06: qty 2

## 2016-11-06 MED ORDER — MIDAZOLAM HCL 10 MG/2ML IJ SOLN
INTRAMUSCULAR | Status: DC | PRN
  Administered 2016-11-06 (×2): 2 mg via INTRAVENOUS
  Administered 2016-11-06: 1 mg via INTRAVENOUS

## 2016-11-06 MED ORDER — DIPHENHYDRAMINE HCL 50 MG/ML IJ SOLN
INTRAMUSCULAR | Status: AC
  Filled 2016-11-06: qty 1

## 2016-11-06 MED ORDER — MIDAZOLAM HCL 5 MG/ML IJ SOLN
INTRAMUSCULAR | Status: AC
  Filled 2016-11-06: qty 2

## 2016-11-06 MED ORDER — FENTANYL CITRATE (PF) 100 MCG/2ML IJ SOLN
INTRAMUSCULAR | Status: DC | PRN
  Administered 2016-11-06 (×2): 25 ug via INTRAVENOUS

## 2016-11-06 NOTE — H&P (Signed)
11/05/2016 [facility]  Cooper, Casimiro NeedleMichael, MD  Cardiology   ASD (atrial septal defect)  Dx   New Patient (Initial Visit) ; Referred by Almond LintIngal, Aileen, MD  Reason for Visit   Additional Documentation   Vitals:   BP 114/70   Pulse 90   Ht 5\' 7"  (1.702 m)   Wt 89.4 kg (197 lb)   BMI 30.85 kg/m   BSA 2.06 m   Flowsheets:   Custom Formula Data,   MEWS Score,   Anthropometrics,   MD PAD Screen Review     Encounter Info:   Billing Info,   History,   Allergies,   Detailed Report     All Notes   Progress Notes by Tonny Bollmanooper, Michael, MD at 11/05/2016 9:40 AM   Author: Tonny Bollmanooper, Michael, MD Author Type: Physician Filed: 11/05/2016 12:08 PM  Note Status: Signed Cosign: Cosign Not Required Encounter Date: 11/05/2016  Editor: Tonny Bollmanooper, Michael, MD (Physician)       Cardiology Office Note Date:  11/05/2016   ID:  Kenneth BondMohammed Thivierge, DOB 04-Jul-1990, MRN [id number]  PCP:  Patient, No Pcp Per      Cardiologist:  Dr Alvino ChapelIngal      Chief Complaint  Patient presents with  . New Patient (Initial Visit)    ASD consult     History of Present Illness: Kenneth James is a 26 y.o. male who presents for Evaluation of an atrial septal defect. He is referred by Dr. Alvino ChapelIngal for consideration of treatment options.  The patient is here alone today. He is originally from [place] and plans to return in a few months. He has been here for college. The patient was evaluated for atypical chest pain. He underwent an echocardiogram which incidentally demonstrated a dilated right ventricle and right atrium. He underwent a repeat study with gated saline which identified an atrial septal defect primarily with left to right shunting but also with bidirectional flow and positive bubble study.  The patient has no previous history of heart disease. He reports that his parents recently told him he had some trouble breathing when he was 26 years old and was told he had asthma at that time.  He has not required any specific treatments for asthma. He's had no shortness of breath as a teenager or an adult. He is physically active and exercises regularly without exertional symptoms. Today, he denies symptoms of palpitations, chest pain, shortness of breath, orthopnea, PND, lower extremity edema, dizziness, or syncope.  He's never been hospitalized except for a few episodes of hypoglycemia. He was diagnosed with type 1 diabetes at age 89. He's had no other medical problems. States that his diabetes has been well controlled.  The patient's paternal aunt underwent surgical ASD closure at age 25. She is now 25 years old and doing well.  Past Medical History:  Diagnosis Date  . Diabetes mellitus without complication (HCC)     History reviewed. No pertinent surgical history.        Current Outpatient Prescriptions  Medication Sig Dispense Refill  . insulin NPH-regular Human (HUMULIN 70/30) (70-30) 100 UNIT/ML injection 28 units with breakfast, and 22 units with the evening meal, and syringes 2/day 20 mL 11  . losartan (COZAAR) 25 MG tablet Take 1 tablet (25 mg total) by mouth daily. 30 tablet 11  . ONE TOUCH ULTRA TEST test strip TEST 7 TIMES PER DAY, AND LANCETS 7/DAY 150 each 2  . aspirin EC 81 MG tablet Take 1 tablet (81 mg  total) by mouth daily.     No current facility-administered medications for this visit.     Allergies:   Patient has no known allergies.   Social History:  The patient  reports that he quit smoking about 2 years ago. His smoking use included Cigarettes. He has a 5.00 pack-year smoking history. He has never used smokeless tobacco. He reports that he does not drink alcohol or use drugs.   Family History:  The patient's  family history includes Diabetes in his other, paternal grandfather, and sister.  ASD in paternal aunt  ROS:  Please see the history of present illness.   All other systems are reviewed and negative.    PHYSICAL EXAM: VS:  BP  114/70   Pulse 90   Ht 5\' 7"  (1.702 m)   Wt 197 lb (89.4 kg)   BMI 30.85 kg/m  , BMI Body mass index is 30.85 kg/m. GEN: Well nourished, well developed, in no acute distress  HEENT: normal  Neck: no JVD, no masses. No carotid bruits Cardiac: RRR without murmur or gallop                Respiratory:  clear to auscultation bilaterally, normal work of breathing GI: soft, nontender, nondistended, + BS MS: no deformity or atrophy  Ext: no pretibial edema, pedal pulses 2+= bilaterally Skin: warm and dry, no rash Neuro:  Strength and sensation are intact Psych: euthymic mood, full affect  EKG:  EKG is not ordered today. The ekgs from 08/17/2016 and 09/09/2016 demonstrated normal sinus rhythm with right axis deviation.  Recent Labs: 08/17/2016: TSH 1.16 09/09/2016: BUN 10; Creatinine, Ser 0.84; Hemoglobin 14.4; Platelets 332; Potassium 4.0; Sodium 131 10/30/2016: ALT 15   Lipid Panel          Component Value Date/Time   CHOL 114 08/17/2016 1455   TRIG 43.0 08/17/2016 1455   HDL 44.40 08/17/2016 1455   CHOLHDL 3 08/17/2016 1455   VLDL 8.6 08/17/2016 1455   LDLCALC 61 08/17/2016 1455         Wt Readings from Last 3 Encounters:  11/05/16 197 lb (89.4 kg)  10/30/16 196 lb (88.9 kg)  10/23/16 196 lb 8 oz (89.1 kg)     Cardiac Studies Reviewed: 2D Echo 10-18-2016: Study Conclusions  - Left ventricle: The cavity size was normal. Wall thickness was normal. Systolic function was normal. The estimated ejection fraction was in the range of 60% to 65%. Wall motion was normal; there were no regional wall motion abnormalities. - Right ventricle: The cavity size was mildly dilated. Wall thickness was normal. Systolic function was moderately reduced. - Right atrium: The atrium was mildly dilated. - Atrial septum: There was a probable, large secundum atrial septal defect. There was a large bidirectional, but predominantly left-to-right, shunt through an atrial  septal defect. Echo contrast study showed a small right-to-left atrial level shunt, in the baseline state. - Pulmonary arteries: Systolic pressure could not be accurately estimated.  Echo 10-11-2016: Left ventricle: The cavity size was normal. Wall thickness was normal. Systolic function was normal. The estimated ejection fraction was in the range of 55% to 60%. Wall motion was normal; there were no regional wall motion abnormalities. Left ventricular diastolic function parameters were normal.  ------------------------------------------------------------------- Aortic valve: Trileaflet. Doppler: There was no stenosis. There was no regurgitation. VTI ratio of LVOT to aortic valve: 0.88. Valve area (VTI): 2.24 cm^2. Indexed valve area (VTI): 1.07 cm^2/m^2. Mean velocity ratio of LVOT to aortic valve: 0.86. Valve area (  Vmean): 2.19 cm^2. Indexed valve area (Vmean): 1.05 cm^2/m^2. Mean gradient (S): 3 mm Hg.  ------------------------------------------------------------------- Aorta: Aortic root: The aortic root was normal in size. Ascending aorta: The ascending aorta was normal in size.  ------------------------------------------------------------------- Mitral valve: Structurally normal valve. Leaflet separation was normal. Doppler: Transvalvular velocity was within the normal range. There was no evidence for stenosis. There was no significant regurgitation. Valve area by pressure half-time: 3.73 cm^2. Indexed valve area by pressure half-time: 1.78 cm^2/m^2. Peak gradient (D): 3 mm Hg.  ------------------------------------------------------------------- Left atrium: The atrium was normal in size.  ------------------------------------------------------------------- Right ventricle: The cavity size was mildly dilated. Systolic function was normal.  ------------------------------------------------------------------- Pulmonic valve: Poorly  visualized. Doppler: There was no significant regurgitation.  ------------------------------------------------------------------- Tricuspid valve: Structurally normal valve. Leaflet separation was normal. Doppler: Transvalvular velocity was within the normal range. There was mild-moderate regurgitation.  ------------------------------------------------------------------- Pulmonary artery: Systolic pressure was within the normal range.  ------------------------------------------------------------------- Right atrium: The atrium was mildly dilated.  ------------------------------------------------------------------- Pericardium: There was no pericardial effusion.  ------------------------------------------------------------------- Systemic veins: Inferior vena cava: The vessel was normal in size. The respirophasic diameter changes were in the normal range (>= 50%), consistent with normal central venous pressure.  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 The patient appears to be asymptomatic. His only significant comorbid medical condition is type 1 diabetes which appears to be well-controlled.  We discussed the natural history of atrial septal defect today. The patient's echo studies demonstrate dilatation of the right ventricle and right atrium, suggestive of a hemodynamically significant left to right shunt. We discussed consideration of ASD closure and reviewed the options of surgical closure versus transcatheter closure. In order to evaluate the patient's interatrial septal anatomy better, a TEE is necessary. I have reviewed the risks, indications, and alternatives to TEE evaluation. This will be scheduled tomorrow with Dr. Jens Som has an outpatient. Pending that evaluation, the patient may be a candidate for percutaneous transcatheter ASD closure which is clearly indicated in the context of right heart dilatation in order to prevent  complications of pulmonary hypertension and right-sided congestive heart failure in the future.   If his anatomy is favorable, we reviewed the details of transcatheter ASD closure. I reviewed the risks, indications, and alternatives to transcatheter closure with the patient. Specific risks include bleeding, infection, device embolization, stroke, cardiac perforation, tamponade, arrhythmia, MI, and late device erosion. He understands these serious risks occur at low incidence of < 1%. I will FU with him by telephone after his TEE to further discuss options.   Current medicines are reviewed with the patient today.  The patient does not have concerns regarding medicines.  Labs/ tests ordered today include:  No orders of the defined types were placed in this encounter.  Disposition:   ASD closure pending TEE evaluation  Signed, Tonny Bollman, MD  11/05/2016 11:57 AM    [facility] [address] [place], [place], [place]  [address] Phone: [phone]; Fax: [phone]      For TEE; no changes Olga Millers

## 2016-11-06 NOTE — CV Procedure (Signed)
    Transesophageal Echocardiogram Note  Kenneth BondMohammed James [id number] 06/03/1991  Procedure: Transesophageal Echocardiogram Indications: ASD  Procedure Details Consent: Obtained Time Out: Verified patient identification, verified procedure, site/side was marked, verified correct patient position, special equipment/implants available, Radiology Safety Procedures followed,  medications/allergies/relevent history reviewed, required imaging and test results available.  Performed  Medications:  During this procedure the patient is administered a total of Versed 5 mg and Fentanyl 50 mcg  to achieve and maintain moderate conscious sedation.  The patient's heart rate, blood pressure, and oxygen saturation are monitored continuously during the procedure. The period of conscious sedation is 30 minutes, of which I was present face-to-face 100% of this time.  Normal LV function; large secundum ASD with left to right shunt; moderate RAE; mild RVE.   Complications: No apparent complications Patient did tolerate procedure well.  Olga MillersBrian Crenshaw, MD

## 2016-11-06 NOTE — Discharge Instructions (Signed)

## 2016-11-06 NOTE — Interval H&P Note (Signed)
History and Physical Interval Note:  11/06/2016 8:30 AM  Kenneth James  has presented today for surgery, with the diagnosis of ATRIAL SEPTA DEFECT  The various methods of treatment have been discussed with the patient and family. After consideration of risks, benefits and other options for treatment, the patient has consented to  Procedure(s): TRANSESOPHAGEAL ECHOCARDIOGRAM (TEE) (N/A) as a surgical intervention .  The patient's history has been reviewed, patient examined, no change in status, stable for surgery.  I have reviewed the patient's chart and labs.  Questions were answered to the patient's satisfaction.     Olga MillersBrian Davianna Deutschman

## 2016-11-07 ENCOUNTER — Telehealth: Payer: Self-pay | Admitting: Cardiovascular Disease

## 2016-11-07 ENCOUNTER — Encounter (HOSPITAL_COMMUNITY): Payer: Self-pay | Admitting: Cardiology

## 2016-11-07 NOTE — Telephone Encounter (Signed)
New message      Calling to get TEE results

## 2016-11-07 NOTE — Telephone Encounter (Signed)
Returned call to patient.Advised TEE results not available.Dr.Cooper's nurse not in office today.Advised I will send message to her.

## 2016-11-07 NOTE — Telephone Encounter (Signed)
Attempted to call patient. There was no answer and voicemail has not been set up.

## 2016-11-07 NOTE — Telephone Encounter (Signed)
Follow up ° ° ° °Pt is returning call to Brittany. °

## 2016-11-08 MED ORDER — CLOPIDOGREL BISULFATE 75 MG PO TABS: 75.0000 mg | ORAL_TABLET | Freq: Every day | ORAL | 6 refills | Status: DC

## 2016-11-08 NOTE — Telephone Encounter (Signed)
Discussed with Dr Excell Seltzerooper and okay to schedule ASD closure if pt would like to proceed. I spoke with pt and he would like to proceed. Procedure scheduled on 11/23/16 and Kathlene NovemberMike device rep aware.  Pt will start plavix 75mg  daily on 11/16/16 and continue ASA.       Kenneth James  11/08/2016  You are scheduled for an Atrial Septal Defect Closure on Friday, June 1 with Dr. Tonny BollmanMichael Cooper.  1. [facility] at [facility]: [address] [place], [place] [address] at 8:30 AM (two hours before your procedure to ensure your preparation). Free valet parking service is available.   Special note: Every effort is made to have your procedure done on time. Please understand that emergencies sometimes delay scheduled procedures.  2. Diet: Do not eat or drink anything after midnight prior to your procedure except sips of water to take medications.  3. Labs: Your labs will be performed at the hospital after you arrive for your procedure.  4. Medication instructions in preparation for your procedure:  Start Plavix (clopidogrel) 75mg  take one tablet by mouth daily. You should start this medication 1 week prior to procedure, 11/16/2016. A prescription has been sent to your pharmacy.      Take only half your normal dosage/ units of insulin the night before your procedure. Do not take any insulin on the day of the procedure.  On the morning of your procedure, take your Aspirin and Plavix/Clopidogrel and any morning medicines NOT listed above.  You may use sips of water.  5. Plan for one night stay--bring personal belongings.  6. Bring a current list of your medications and current insurance cards.  7. You MUST have a responsible person to drive you home.  8. Someone MUST be with you the first 24 hours after you arrive home or your discharge will be delayed.  9. Please wear clothes that are easy to get on and off and wear slip-on shoes.  Thank you for allowing  us to care for you!   -- [facility]

## 2016-11-08 NOTE — Telephone Encounter (Signed)
I spoke with the pt and reviewed ASD closure instructions over the phone.  I also made him aware that instructions are available for him to review in My Chart.  The pt will contact the office with any additional questions or concerns. The pt has already be contacted by his pharmacy that Plavix is ready for pick up.

## 2016-11-14 ENCOUNTER — Ambulatory Visit: Payer: PPO | Admitting: Endocrinology

## 2016-11-14 ENCOUNTER — Encounter: Payer: Self-pay | Admitting: Cardiovascular Disease

## 2016-11-15 NOTE — Telephone Encounter (Signed)
New Message      Pt called would like to ask about  clopidogrel (PLAVIX) 75 MG tablet Take 1 tablet (75 mg total) by mouth daily.    no one answered his my chart question.

## 2016-11-16 ENCOUNTER — Telehealth: Payer: Self-pay | Admitting: Cardiovascular Disease

## 2016-11-16 NOTE — Telephone Encounter (Signed)
New message    Pt is calling asking for a call back about the message he sent through mychart.

## 2016-11-16 NOTE — Telephone Encounter (Signed)
I spoke with the pt and he has reviewed my chart response from Dr Excell Seltzerooper. I answered additional questions by phone.

## 2016-11-23 ENCOUNTER — Ambulatory Visit (HOSPITAL_COMMUNITY)
Admission: RE | Admit: 2016-11-23 | Discharge: 2016-11-24 | Disposition: A | Discharge status: Home / Self Care | Payer: PPO | Source: Ambulatory Visit | Attending: Cardiovascular Disease | Admitting: Cardiovascular Disease

## 2016-11-23 ENCOUNTER — Encounter (HOSPITAL_COMMUNITY)
Admission: RE | Disposition: A | Discharge status: Home / Self Care | Payer: Self-pay | Source: Ambulatory Visit | Attending: Cardiovascular Disease

## 2016-11-23 DIAGNOSIS — Z7982 Long term (current) use of aspirin: Secondary | ICD-10-CM | POA: Insufficient documentation

## 2016-11-23 DIAGNOSIS — Z7902 Long term (current) use of antithrombotics/antiplatelets: Secondary | ICD-10-CM | POA: Diagnosis not present

## 2016-11-23 DIAGNOSIS — Z833 Family history of diabetes mellitus: Secondary | ICD-10-CM | POA: Insufficient documentation

## 2016-11-23 DIAGNOSIS — I491 Atrial premature depolarization: Secondary | ICD-10-CM | POA: Diagnosis not present

## 2016-11-23 DIAGNOSIS — Q211 Atrial septal defect, unspecified: Secondary | ICD-10-CM

## 2016-11-23 DIAGNOSIS — E109 Type 1 diabetes mellitus without complications: Secondary | ICD-10-CM | POA: Insufficient documentation

## 2016-11-23 DIAGNOSIS — Z794 Long term (current) use of insulin: Secondary | ICD-10-CM | POA: Insufficient documentation

## 2016-11-23 DIAGNOSIS — R42 Dizziness and giddiness: Secondary | ICD-10-CM | POA: Insufficient documentation

## 2016-11-23 DIAGNOSIS — Z87891 Personal history of nicotine dependence: Secondary | ICD-10-CM | POA: Insufficient documentation

## 2016-11-23 HISTORY — PX: ATRIAL SEPTAL DEFECT(ASD) CLOSURE: CATH118299

## 2016-11-23 LAB — GLUCOSE, CAPILLARY
GLUCOSE-CAPILLARY: 85 mg/dL (ref 65–99)
GLUCOSE-CAPILLARY: 78 mg/dL (ref 65–99)
GLUCOSE-CAPILLARY: 151 mg/dL — AB (ref 65–99)
Glucose-Capillary: 68 mg/dL (ref 65–99)
Glucose-Capillary: 114 mg/dL — ABNORMAL HIGH (ref 65–99)
Glucose-Capillary: 66 mg/dL (ref 65–99)
Glucose-Capillary: 70 mg/dL (ref 65–99)
Glucose-Capillary: 129 mg/dL — ABNORMAL HIGH (ref 65–99)
Glucose-Capillary: 58 mg/dL — ABNORMAL LOW (ref 65–99)
Glucose-Capillary: 334 mg/dL — ABNORMAL HIGH (ref 65–99)

## 2016-11-23 LAB — BASIC METABOLIC PANEL
Anion gap: 7 (ref 5–15)
BUN: 9 mg/dL (ref 6–20)
CALCIUM: 9.2 mg/dL (ref 8.9–10.3)
CHLORIDE: 106 mmol/L (ref 101–111)
CO2: 25 mmol/L (ref 22–32)
CREATININE: 0.83 mg/dL (ref 0.61–1.24)
GFR calc Af Amer: >60 mL/min (ref >60)
GFR calc non Af Amer: >60 mL/min (ref >60)
Glucose, Bld: 60 mg/dL — ABNORMAL LOW (ref 65–99)
Potassium: 3.6 mmol/L (ref 3.5–5.1)
SODIUM: 138 mmol/L (ref 135–145)

## 2016-11-23 LAB — POCT ACTIVATED CLOTTING TIME
ACTIVATED CLOTTING TIME: 224 s
ACTIVATED CLOTTING TIME: 213 s
ACTIVATED CLOTTING TIME: 224 s
Activated Clotting Time: 246 seconds
Activated Clotting Time: 142 seconds

## 2016-11-23 LAB — CBC
HCT: 45.1 % (ref 39.0–52.0)
HEMOGLOBIN: 16 g/dL (ref 13.0–17.0)
MCH: 29.7 pg (ref 26.0–34.0)
MCHC: 35.5 g/dL (ref 30.0–36.0)
MCV: 83.7 fL (ref 78.0–100.0)
Platelets: 314 10*3/uL (ref 150–400)
RBC: 5.39 MIL/uL (ref 4.22–5.81)
RDW: 12.3 % (ref 11.5–15.5)
WBC: 8.9 10*3/uL (ref 4.0–10.5)

## 2016-11-23 LAB — PROTIME-INR
INR: 1.01
PROTHROMBIN TIME: 13.3 s (ref 11.4–15.2)

## 2016-11-23 SURGERY — ATRIAL SEPTAL DEFECT (ASD) CLOSURE
Anesthesia: LOCAL

## 2016-11-23 MED ORDER — HEPARIN (PORCINE) IN NACL 2-0.9 UNIT/ML-% IJ SOLN
INTRAMUSCULAR | Status: AC
  Filled 2016-11-23: qty 500

## 2016-11-23 MED ORDER — LIDOCAINE HCL (PF) 1 % IJ SOLN
INTRAMUSCULAR | Status: DC | PRN
  Administered 2016-11-23: 20 mL

## 2016-11-23 MED ORDER — DEXTROSE 50 % IV SOLN
25.0000 mL | INTRAVENOUS | Status: DC | PRN
  Administered 2016-11-23 (×2): 25 mL via INTRAVENOUS

## 2016-11-23 MED ORDER — MIDAZOLAM HCL 2 MG/2ML IJ SOLN
INTRAMUSCULAR | Status: AC
  Filled 2016-11-23: qty 2

## 2016-11-23 MED ORDER — FENTANYL CITRATE (PF) 100 MCG/2ML IJ SOLN
INTRAMUSCULAR | Status: DC | PRN
Start: 2016-11-23 — End: 2016-11-23
  Administered 2016-11-23 (×4): 50 ug via INTRAVENOUS

## 2016-11-23 MED ORDER — DEXTROSE-NACL 5-0.45 % IV SOLN: INTRAVENOUS | Status: DC

## 2016-11-23 MED ORDER — SODIUM CHLORIDE 0.9% FLUSH: 3.0000 mL | INTRAVENOUS | Status: DC | PRN

## 2016-11-23 MED ORDER — CLOPIDOGREL BISULFATE 75 MG PO TABS: 75.0000 mg | ORAL_TABLET | ORAL | Status: DC

## 2016-11-23 MED ORDER — CEFAZOLIN SODIUM-DEXTROSE 1-4 GM/50ML-% IV SOLN
1.0000 g | Freq: Once | INTRAVENOUS | Status: AC
  Administered 2016-11-23: 1 g via INTRAVENOUS

## 2016-11-23 MED ORDER — HEPARIN SODIUM (PORCINE) 1000 UNIT/ML IJ SOLN
INTRAMUSCULAR | Status: AC
  Filled 2016-11-23: qty 1

## 2016-11-23 MED ORDER — SODIUM CHLORIDE 0.9 % IV SOLN: 250.0000 mL | INTRAVENOUS | Status: DC | PRN

## 2016-11-23 MED ORDER — SODIUM CHLORIDE 0.9 % WEIGHT BASED INFUSION
1.0000 mL/kg/h | INTRAVENOUS | Status: AC
  Administered 2016-11-23: 1 mL/kg/h via INTRAVENOUS

## 2016-11-23 MED ORDER — HEPARIN (PORCINE) IN NACL 2-0.9 UNIT/ML-% IJ SOLN
INTRAMUSCULAR | Status: AC | PRN
  Administered 2016-11-23: 500 mL
  Administered 2016-11-23: 2500 mL

## 2016-11-23 MED ORDER — MIDAZOLAM HCL 2 MG/2ML IJ SOLN
INTRAMUSCULAR | Status: DC | PRN
  Administered 2016-11-23: 1 mg via INTRAVENOUS
  Administered 2016-11-23: 2 mg via INTRAVENOUS
  Administered 2016-11-23 (×3): 1 mg via INTRAVENOUS

## 2016-11-23 MED ORDER — IOPAMIDOL (ISOVUE-370) INJECTION 76%
INTRAVENOUS | Status: AC
  Filled 2016-11-23: qty 50

## 2016-11-23 MED ORDER — DEXTROSE 50 % IV SOLN
INTRAVENOUS | Status: DC | PRN
  Administered 2016-11-23: 25 mL via INTRAVENOUS

## 2016-11-23 MED ORDER — ASPIRIN EC 81 MG PO TBEC: 81.0000 mg | DELAYED_RELEASE_TABLET | Freq: Every day | ORAL | Status: DC

## 2016-11-23 MED ORDER — ONDANSETRON HCL 4 MG/2ML IJ SOLN: 4.0000 mg | Freq: Four times a day (QID) | INTRAMUSCULAR | Status: DC | PRN

## 2016-11-23 MED ORDER — INSULIN ASPART 100 UNIT/ML ~~LOC~~ SOLN
0.0000 [IU] | Freq: Three times a day (TID) | SUBCUTANEOUS | Status: DC
  Administered 2016-11-24: 11 [IU] via SUBCUTANEOUS

## 2016-11-23 MED ORDER — LIDOCAINE HCL (PF) 1 % IJ SOLN
INTRAMUSCULAR | Status: AC
  Filled 2016-11-23: qty 30

## 2016-11-23 MED ORDER — SODIUM CHLORIDE 0.9% FLUSH
3.0000 mL | Freq: Two times a day (BID) | INTRAVENOUS | Status: DC
  Administered 2016-11-23: 19:00:00 3 mL via INTRAVENOUS

## 2016-11-23 MED ORDER — SODIUM CHLORIDE 0.9 % IV BOLUS (SEPSIS): 500.0000 mL | Freq: Once | INTRAVENOUS | Status: DC

## 2016-11-23 MED ORDER — INSULIN ASPART PROT & ASPART (70-30 MIX) 100 UNIT/ML ~~LOC~~ SUSP
22.0000 [IU] | Freq: Every day | SUBCUTANEOUS | Status: DC
  Administered 2016-11-23: 21:00:00 22 [IU] via SUBCUTANEOUS
  Filled 2016-11-23: qty 10

## 2016-11-23 MED ORDER — CEFAZOLIN SODIUM-DEXTROSE 1-4 GM/50ML-% IV SOLN
INTRAVENOUS | Status: AC
  Administered 2016-11-23: 1 g via INTRAVENOUS
  Filled 2016-11-23: qty 50

## 2016-11-23 MED ORDER — ACETAMINOPHEN 325 MG PO TABS: 650.0000 mg | ORAL_TABLET | ORAL | Status: DC | PRN

## 2016-11-23 MED ORDER — DEXTROSE 50 % IV SOLN
INTRAVENOUS | Status: AC
  Administered 2016-11-23: 25 mL via INTRAVENOUS
  Filled 2016-11-23: qty 50

## 2016-11-23 MED ORDER — INSULIN ASPART PROT & ASPART (70-30 MIX) 100 UNIT/ML ~~LOC~~ SUSP
28.0000 [IU] | Freq: Every day | SUBCUTANEOUS | Status: DC
  Administered 2016-11-24: 08:00:00 28 [IU] via SUBCUTANEOUS
  Filled 2016-11-23: qty 10

## 2016-11-23 MED ORDER — FENTANYL CITRATE (PF) 100 MCG/2ML IJ SOLN
INTRAMUSCULAR | Status: AC
  Filled 2016-11-23: qty 2

## 2016-11-23 MED ORDER — SODIUM CHLORIDE 0.9 % WEIGHT BASED INFUSION
3.0000 mL/kg/h | INTRAVENOUS | Status: DC
  Administered 2016-11-23: 3 mL/kg/h via INTRAVENOUS

## 2016-11-23 MED ORDER — ASPIRIN 81 MG PO CHEW: 81.0000 mg | CHEWABLE_TABLET | ORAL | Status: DC

## 2016-11-23 MED ORDER — SODIUM CHLORIDE 0.9% FLUSH: 3.0000 mL | Freq: Two times a day (BID) | INTRAVENOUS | Status: DC

## 2016-11-23 MED ORDER — SODIUM CHLORIDE 0.9 % WEIGHT BASED INFUSION: 1.0000 mL/kg/h | INTRAVENOUS | Status: DC

## 2016-11-23 MED ORDER — CLOPIDOGREL BISULFATE 75 MG PO TABS: 75.0000 mg | ORAL_TABLET | Freq: Every day | ORAL | Status: DC

## 2016-11-23 MED ORDER — DEXTROSE 50 % IV SOLN
INTRAVENOUS | Status: AC
  Filled 2016-11-23: qty 50

## 2016-11-23 MED ORDER — HEPARIN SODIUM (PORCINE) 1000 UNIT/ML IJ SOLN
INTRAMUSCULAR | Status: DC | PRN
  Administered 2016-11-23 (×3): 2000 [IU] via INTRAVENOUS
  Administered 2016-11-23: 6000 [IU] via INTRAVENOUS

## 2016-11-23 SURGICAL SUPPLY — 19 items
BALLN SIZING AMPLATZER 24 (BALLOONS) ×2
BALLN SIZING AMPLATZER 34 (BALLOONS) ×2
BALLOON SIZING AMPLATZER 24 (BALLOONS) ×1 IMPLANT
BALLOON SIZING AMPLATZER 34 (BALLOONS) ×1 IMPLANT
CATH ACUNAV REPROCESSED (CATHETERS) ×2 IMPLANT
CATH INFINITI 6F MPA2 100CM (CATHETERS) ×2 IMPLANT
COVER SWIFTLINK CONNECTOR (BAG) ×2 IMPLANT
GUIDEWIRE AMPLATZER 1.5JX260 (WIRE) ×2 IMPLANT
OCCLUDER AMPLATZER SEPTAL 20MM (Prosthesis & Implant Heart) IMPLANT
OCCLUDER AMPLATZER SEPTAL 24MM (Prosthesis & Implant Heart) ×2 IMPLANT
PACK CARDIAC CATHETERIZATION (CUSTOM PROCEDURE TRAY) ×2 IMPLANT
PROTECTION STATION PRESSURIZED (MISCELLANEOUS) ×2
SHEATH FAST CATH 12F 12CM (SHEATH) ×2 IMPLANT
SHEATH INTROD W/O MIN 9FR 25CM (SHEATH) ×2 IMPLANT
SHEATH PINNACLE 8F 10CM (SHEATH) ×2 IMPLANT
SHEATH PINNACLE 9F 10CM (SHEATH) ×2 IMPLANT
STATION PROTECTION PRESSURIZED (MISCELLANEOUS) ×1 IMPLANT
SYSTEM DELIVERY AMPLATZER 9FR (SHEATH) ×2 IMPLANT
WIRE EMERALD 3MM-J .035X150CM (WIRE) ×2 IMPLANT

## 2016-11-23 NOTE — Progress Notes (Signed)
Pt s/p ASD closure. Bedrest up. Pt assisted OOB  to br to void. Pt requested to sit in the chair for a little while. As I was straightening out his bed pt c/o being dizzy.VS obtained Pt is diaphoretic and hypotensive 82/43. Stat CBG obtained. CBG now 219. Placed back in bed Cool cloth applied to forehead. Monitor shows SR with PJc's and PAC's. Dr. Drenda Freezeancelli notified.

## 2016-11-23 NOTE — Progress Notes (Signed)
Hypoglycemic Event  CBG: 68  Treatment: D50 IV 25 mL  Symptoms: None  Follow-up CBG: [id number] CBG Result:85  Possible Reasons for Event: Inadequate meal intake      London PepperWalker, Yuridia Couts Tysinger

## 2016-11-23 NOTE — Progress Notes (Addendum)
Site area: Right groin a 8 and 9 french venous sheath was removed  Site Prior to Removal:  Level 1-bruise  Pressure Applied For 30 MINUTES    Bedrest Beginning at 1650p  Manual:   Yes.    Patient Status During Pull:  stable  Post Pull Groin Site:  Level 1-bruise  Post Pull Instructions Given:  Yes.    Post Pull Pulses Present:  Yes.    Dressing Applied:  Yes.    Comments: VS remain stable during sheath pull

## 2016-11-23 NOTE — Interval H&P Note (Signed)
History and Physical Interval Note:  11/23/2016 10:53 AM  Kenneth BondMohammed Karg  has presented today for surgery, with the diagnosis of ASD  The various methods of treatment have been discussed with the patient and family. After consideration of risks, benefits and other options for treatment, the patient has consented to  Procedure(s): Atrial Septal Defect(ASD) Closure (N/A) as a surgical intervention .  The patient's history has been reviewed, patient examined, no change in status, stable for surgery.  I have reviewed the patient's chart and labs.  Questions were answered to the patient's satisfaction.     Tonny Bollmanooper, Mycheal Veldhuizen

## 2016-11-23 NOTE — Progress Notes (Signed)
Hypoglycemic Event  CBG: 66   Treatment: D50 IV 25 mL  Symptoms: None  Possible Reasons for Event: Inadequate meal intake  Comments: Will need to recheck CBG @ 1030    London PepperWalker, Tamiko Leopard Tysinger

## 2016-11-23 NOTE — H&P (View-Only) (Signed)
Cardiology Office Note Date:  11/05/2016   ID:  Kenneth James, DOB [birth year], MRN [id number]  PCP:  Patient, No Pcp Per  Cardiologist:  Dr Alvino Chapel  Chief Complaint  Patient presents with  . New Patient (Initial Visit)    ASD consult     History of Present Illness: Kenneth James is a 26 y.o. male who presents for Evaluation of an atrial septal defect. He is referred by Dr. Alvino Chapel for consideration of treatment options.  The patient is here alone today. He is originally from [place] and plans to return in a few months. He has been here for college. The patient was evaluated for atypical chest pain. He underwent an echocardiogram which incidentally demonstrated a dilated right ventricle and right atrium. He underwent a repeat study with gated saline which identified an atrial septal defect primarily with left to right shunting but also with bidirectional flow and positive bubble study.  The patient has no previous history of heart disease. He reports that his parents recently told him he had some trouble breathing when he was 26 years old and was told he had asthma at that time. He has not required any specific treatments for asthma. He's had no shortness of breath as a teenager or an adult. He is physically active and exercises regularly without exertional symptoms. Today, he denies symptoms of palpitations, chest pain, shortness of breath, orthopnea, PND, lower extremity edema, dizziness, or syncope.  He's never been hospitalized except for a few episodes of hypoglycemia. He was diagnosed with type 1 diabetes at age 32. He's had no other medical problems. States that his diabetes has been well controlled.  The patient's paternal aunt underwent surgical ASD closure at age 25. She is now 25 years old and doing well.  Past Medical History:  Diagnosis Date  . Diabetes mellitus without complication (HCC)     History reviewed. No pertinent surgical history.  Current Outpatient  Prescriptions  Medication Sig Dispense Refill  . insulin NPH-regular Human (HUMULIN 70/30) (70-30) 100 UNIT/ML injection 28 units with breakfast, and 22 units with the evening meal, and syringes 2/day 20 mL 11  . losartan (COZAAR) 25 MG tablet Take 1 tablet (25 mg total) by mouth daily. 30 tablet 11  . ONE TOUCH ULTRA TEST test strip TEST 7 TIMES PER DAY, AND LANCETS 7/DAY 150 each 2  . aspirin EC 81 MG tablet Take 1 tablet (81 mg total) by mouth daily.     No current facility-administered medications for this visit.     Allergies:   Patient has no known allergies.   Social History:  The patient  reports that he quit smoking about 2 years ago. His smoking use included Cigarettes. He has a 5.00 pack-year smoking history. He has never used smokeless tobacco. He reports that he does not drink alcohol or use drugs.   Family History:  The patient's  family history includes Diabetes in his other, paternal grandfather, and sister.  ASD in paternal aunt  ROS:  Please see the history of present illness.   All other systems are reviewed and negative.    PHYSICAL EXAM: VS:  BP 114/70   Pulse 90   Ht 5\' 7"  (1.702 m)   Wt 197 lb (89.4 kg)   BMI 30.85 kg/m  , BMI Body mass index is 30.85 kg/m. GEN: Well nourished, well developed, in no acute distress  HEENT: normal  Neck: no JVD, no masses. No carotid bruits Cardiac: RRR without murmur or  gallop                Respiratory:  clear to auscultation bilaterally, normal work of breathing GI: soft, nontender, nondistended, + BS MS: no deformity or atrophy  Ext: no pretibial edema, pedal pulses 2+= bilaterally Skin: warm and dry, no rash Neuro:  Strength and sensation are intact Psych: euthymic mood, full affect  EKG:  EKG is not ordered today. The ekgs from 08/17/2016 and 09/09/2016 demonstrated normal sinus rhythm with right axis deviation.  Recent Labs: 08/17/2016: TSH 1.16 09/09/2016: BUN 10; Creatinine, Ser 0.84; Hemoglobin 14.4; Platelets  332; Potassium 4.0; Sodium 131 10/30/2016: ALT 15   Lipid Panel     Component Value Date/Time   CHOL 114 08/17/2016 1455   TRIG 43.0 08/17/2016 1455   HDL 44.40 08/17/2016 1455   CHOLHDL 3 08/17/2016 1455   VLDL 8.6 08/17/2016 1455   LDLCALC 61 08/17/2016 1455      Wt Readings from Last 3 Encounters:  11/05/16 197 lb (89.4 kg)  10/30/16 196 lb (88.9 kg)  10/23/16 196 lb 8 oz (89.1 kg)     Cardiac Studies Reviewed: 2D Echo 10-18-2016: Study Conclusions  - Left ventricle: The cavity size was normal. Wall thickness was   normal. Systolic function was normal. The estimated ejection   fraction was in the range of 60% to 65%. Wall motion was normal;   there were no regional wall motion abnormalities. - Right ventricle: The cavity size was mildly dilated. Wall   thickness was normal. Systolic function was moderately reduced. - Right atrium: The atrium was mildly dilated. - Atrial septum: There was a probable, large secundum atrial septal   defect. There was a large bidirectional, but predominantly   left-to-right, shunt through an atrial septal defect. Echo   contrast study showed a small right-to-left atrial level shunt,   in the baseline state. - Pulmonary arteries: Systolic pressure could not be accurately   estimated.  Echo 10-11-2016: Left ventricle:  The cavity size was normal. Wall thickness was normal. Systolic function was normal. The estimated ejection fraction was in the range of 55% to 60%. Wall motion was normal; there were no regional wall motion abnormalities. Left ventricular diastolic function parameters were normal.  ------------------------------------------------------------------- Aortic valve:   Trileaflet.  Doppler:   There was no stenosis. There was no regurgitation.    VTI ratio of LVOT to aortic valve: 0.88. Valve area (VTI): 2.24 cm^2. Indexed valve area (VTI): 1.07 cm^2/m^2. Mean velocity ratio of LVOT to aortic valve: 0.86. Valve area (Vmean):  2.19 cm^2. Indexed valve area (Vmean): 1.05 cm^2/m^2.    Mean gradient (S): 3 mm Hg.  ------------------------------------------------------------------- Aorta:  Aortic root: The aortic root was normal in size. Ascending aorta: The ascending aorta was normal in size.  ------------------------------------------------------------------- Mitral valve:   Structurally normal valve.   Leaflet separation was normal.  Doppler:  Transvalvular velocity was within the normal range. There was no evidence for stenosis. There was no significant regurgitation.    Valve area by pressure half-time: 3.73 cm^2. Indexed valve area by pressure half-time: 1.78 cm^2/m^2.    Peak gradient (D): 3 mm Hg.  ------------------------------------------------------------------- Left atrium:  The atrium was normal in size.  ------------------------------------------------------------------- Right ventricle:  The cavity size was mildly dilated. Systolic function was normal.  ------------------------------------------------------------------- Pulmonic valve:   Poorly visualized.  Doppler:  There was no significant regurgitation.  ------------------------------------------------------------------- Tricuspid valve:   Structurally normal valve.   Leaflet separation was normal.  Doppler:  Transvalvular  velocity was within the normal range. There was mild-moderate regurgitation.  ------------------------------------------------------------------- Pulmonary artery:   Systolic pressure was within the normal range.   ------------------------------------------------------------------- Right atrium:  The atrium was mildly dilated.  ------------------------------------------------------------------- Pericardium:  There was no pericardial effusion.  ------------------------------------------------------------------- Systemic veins: Inferior vena cava: The vessel was normal in size. The respirophasic diameter  changes were in the normal range (>= 50%), consistent with normal central venous pressure.  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 The patient appears to be asymptomatic. His only significant comorbid medical condition is type 1 diabetes which appears to be well-controlled.  We discussed the natural history of atrial septal defect today. The patient's echo studies demonstrate dilatation of the right ventricle and right atrium, suggestive of a hemodynamically significant left to right shunt. We discussed consideration of ASD closure and reviewed the options of surgical closure versus transcatheter closure. In order to evaluate the patient's interatrial septal anatomy better, a TEE is necessary. I have reviewed the risks, indications, and alternatives to TEE evaluation. This will be scheduled tomorrow with Dr. Jens Somrenshaw has an outpatient. Pending that evaluation, the patient may be a candidate for percutaneous transcatheter ASD closure which is clearly indicated in the context of right heart dilatation in order to prevent complications of pulmonary hypertension and right-sided congestive heart failure in the future.   If his anatomy is favorable, we reviewed the details of transcatheter ASD closure. I reviewed the risks, indications, and alternatives to transcatheter closure with the patient. Specific risks include bleeding, infection, device embolization, stroke, cardiac perforation, tamponade, arrhythmia, MI, and late device erosion. He understands these serious risks occur at low incidence of < 1%. I will FU with him by telephone after his TEE to further discuss options.   Current medicines are reviewed with the patient today.  The patient does not have concerns regarding medicines.  Labs/ tests ordered today include:  No orders of the defined types were placed in this encounter.  Disposition:   ASD closure pending TEE evaluation  Signed, Tonny Bollmanooper, Tallie Hevia, MD    11/05/2016 11:57 AM    [facility] [address], [place], [place]  [address] Phone: [phone]; Fax: [phone]

## 2016-11-24 ENCOUNTER — Ambulatory Visit (HOSPITAL_BASED_OUTPATIENT_CLINIC_OR_DEPARTMENT_OTHER): Payer: PPO

## 2016-11-24 DIAGNOSIS — I491 Atrial premature depolarization: Secondary | ICD-10-CM | POA: Diagnosis not present

## 2016-11-24 DIAGNOSIS — Q211 Atrial septal defect: Secondary | ICD-10-CM | POA: Diagnosis not present

## 2016-11-24 DIAGNOSIS — E109 Type 1 diabetes mellitus without complications: Secondary | ICD-10-CM | POA: Diagnosis not present

## 2016-11-24 LAB — MAGNESIUM: Magnesium: 1.9 mg/dL (ref 1.7–2.4)

## 2016-11-24 LAB — BASIC METABOLIC PANEL
Anion gap: 6 (ref 5–15)
BUN: 7 mg/dL (ref 6–20)
CALCIUM: 9.1 mg/dL (ref 8.9–10.3)
CHLORIDE: 102 mmol/L (ref 101–111)
CO2: 28 mmol/L (ref 22–32)
Creatinine, Ser: 0.83 mg/dL (ref 0.61–1.24)
GFR calc Af Amer: >60 mL/min (ref >60)
GFR calc non Af Amer: >60 mL/min (ref >60)
GLUCOSE: 269 mg/dL — AB (ref 65–99)
Potassium: 4.1 mmol/L (ref 3.5–5.1)
Sodium: 136 mmol/L (ref 135–145)

## 2016-11-24 LAB — ECHOCARDIOGRAM COMPLETE
HEIGHTINCHES: 67 in
WEIGHTICAEL: 3234.59 [oz_av]

## 2016-11-24 LAB — GLUCOSE, CAPILLARY
Glucose-Capillary: 318 mg/dL — ABNORMAL HIGH (ref 65–99)
Glucose-Capillary: 105 mg/dL — ABNORMAL HIGH (ref 65–99)

## 2016-11-24 NOTE — Discharge Summary (Signed)
Discharge Summary    Patient ID: Kenneth James,  MRN: [id number], DOB/AGE: [birth year] 26 y.o.  Admit date: 11/23/2016 Discharge date: 11/24/2016  Primary Care Provider: Patient, No Pcp Per Primary Cardiologist: Dr. Excell Seltzerooper   Discharge Diagnoses    Active Problems:   ASD (atrial septal defect)   Allergies No Known Allergies  Diagnostic Studies/Procedures    Atrial Septal Defect(ASD) Closure  Conclusion   Successful transcatheter ASD closure using a 24 mm Amplatzer Septal Occluder under fluoroscopic and intracardiac echo guidance  Plan: observe overnight, limited echo in am, ASA x 6 months, plavix x 3 months.    Echo 11/24/16 Study Conclusions  - Left ventricle: The cavity size was normal. Systolic function was   normal. The estimated ejection fraction was in the range of 60%   to 65%. Wall motion was normal; there were no regional wall   motion abnormalities. Left ventricular diastolic function   parameters were normal. - Atrial septum: An Amplatzer closure device was present and well   seated. There was no atrial level shunt.   History of Present Illness     26 y.o. male with hx  of type 1 diabetes who recently evaluated by Dr. Alvino ChapelIngal for atypical chest pain. He underwent an echocardiogram which incidentally demonstrated a dilated right ventricle and right atrium. He underwent a repeat study with gated saline which identified an atrial septal defect primarily with left to right shunting but also with bidirectional flow and positive bubble study. The patient was evaluated by Dr. Excell Seltzerooper for further evaluation. TEE showed Normal LV function; large secundum ASD with left to right shunt; moderate RAE; mild RVE. He is admitted for ASD closer.   The patient's paternal aunt underwent surgical ASD closure at age 25. He is originally from [place] and plans to return in a few months. He has been here for college.    Hospital Course     Consultants: None  1. Ostium  secundum ASD - Successful transcatheter ASD closure using a 24 mm Amplatzer Septal Occluder under fluoroscopic and intracardiac echo guidance. Limited echo showed normal LVEF, no effusion and no shunt. - Continue ASA x 6 months, plavix x 3 months.   - Had dizziness over night. BP of 82/43. Given bolus of fluid. With improved BP. Ambulated well without any issue.   2. Frequent PACs - He does feels this. Normal electrolytes.     The patient has been seen by Dr.Karan Ramnauth  today and deemed ready for discharge home. All follow-up appointments have been scheduled. Discharge medications are listed below.  _____________   Discharge Vitals Blood pressure 138/76, pulse 74, temperature 97.7 F (36.5 C), resp. rate 18, height 5\' 7"  (1.702 m), weight 202 lb 2.6 oz (91.7 kg), SpO2 98 %.  Filed Weights   11/23/16 0801 11/23/16 1724 11/24/16 0123  Weight: 189 lb (85.7 kg) 188 lb 15 oz (85.7 kg) 202 lb 2.6 oz (91.7 kg)    Labs & Radiologic Studies     CBC  Recent Labs  11/23/16 0837  WBC 8.9  HGB 16.0  HCT 45.1  MCV 83.7  PLT 314   Basic Metabolic Panel  Recent Labs  11/23/16 0837 11/24/16 0754  NA 138 136  K 3.6 4.1  CL 106 102  CO2 25 28  GLUCOSE 60* 269*  BUN 9 7  CREATININE 0.83 0.83  CALCIUM 9.2 9.1  MG  --  1.9   Liver Function Tests No results for input(s): AST, ALT,  ALKPHOS, BILITOT, PROT, ALBUMIN in the last 72 hours. No results for input(s): LIPASE, AMYLASE in the last 72 hours. Cardiac Enzymes No results for input(s): CKTOTAL, CKMB, CKMBINDEX, TROPONINI in the last 72 hours. BNP Invalid input(s): POCBNP D-Dimer No results for input(s): DDIMER in the last 72 hours. Hemoglobin A1C No results for input(s): HGBA1C in the last 72 hours. Fasting Lipid Panel No results for input(s): CHOL, HDL, LDLCALC, TRIG, CHOLHDL, LDLDIRECT in the last 72 hours. Thyroid Function Tests No results for input(s): TSH, T4TOTAL, T3FREE, THYROIDAB in the last 72 hours.  Invalid  input(s): FREET3  No results found.  Disposition   Pt is being discharged home today in good condition.  Follow-up Plans & Appointments     Discharge Instructions    Diet - low sodium heart healthy    Complete by:  As directed    Discharge instructions    Complete by:  As directed    No driving for 48 hours. No lifting over 5 lbs for 1 week. No sexual activity for 1 week. You may return to work on 11/28/16. Keep procedure site clean & dry. If you notice increased pain, swelling, bleeding or pus, call/return!  You may shower, but no soaking baths/hot tubs/pools for 1 week.   Increase activity slowly    Complete by:  As directed       Discharge Medications   Current Discharge Medication List    CONTINUE these medications which have NOT CHANGED   Details  aspirin EC 81 MG tablet Take 1 tablet (81 mg total) by mouth daily.    Cholecalciferol (VITAMIN D3) 5000 units CAPS Take 5,000 Units by mouth once a week.    clopidogrel (PLAVIX) 75 MG tablet Take 1 tablet (75 mg total) by mouth daily. Qty: 30 tablet, Refills: 6    insulin NPH-regular Human (HUMULIN 70/30) (70-30) 100 UNIT/ML injection 28 units with breakfast, and 22 units with the evening meal, and syringes 2/day Qty: 20 mL, Refills: 11    ONE TOUCH ULTRA TEST test strip TEST 7 TIMES PER DAY, AND LANCETS 7/DAY Qty: 150 each, Refills: 2            Outstanding Labs/Studies     Duration of Discharge Encounter   Greater than 30 minutes including physician time.  Signed, Bhagat,Bhavinkumar PA-C 11/24/2016, 11:43 AM  Personally seen and examined. Agree with above.  ASD closed Aplatzer 24mm  - RRR with occasional ectopy (PAC)  - CTAB, no edema, alert  - ASA Plavix as above  PAC's  - labs look good. K 4.1  Dizzy last night with low BP, possible vagal like phenom.  Feels better but a little nervous Feels PAC's - may be secondary to device and mild inflammatory response.  Bolus IV, responded. ECHO reassuring  device.  OK for DC.  Donato Schultz, MD

## 2016-11-24 NOTE — Progress Notes (Addendum)
Progress Note  Patient Name: Kenneth BondMohammed James Date of Encounter: 11/24/2016  Primary Cardiologist: Dr. Alvino ChapelIngal ASD closer: Dr. Excell Seltzerooper  Subjective   Had dizziness spell yesterday. Better after bolus of fluid.   Inpatient Medications    Scheduled Meds: . aspirin EC  81 mg Oral QHS  . clopidogrel  75 mg Oral Q1500  . insulin aspart  0-15 Units Subcutaneous TID WC  . insulin aspart protamine- aspart  22 Units Subcutaneous Q supper  . insulin aspart protamine- aspart  28 Units Subcutaneous Q breakfast  . sodium chloride flush  3 mL Intravenous Q12H   Continuous Infusions: . sodium chloride    . dextrose 5 % and 0.45% NaCl Stopped (11/23/16 1800)  . sodium chloride Stopped (11/24/16 0045)   PRN Meds: sodium chloride, acetaminophen, dextrose, ondansetron (ZOFRAN) IV, sodium chloride flush   Vital Signs    Vitals:   11/24/16 0200 11/24/16 0300 11/24/16 0400 11/24/16 0704  BP: 126/73  (!) 95/50 (!) 106/55  Pulse: 80  66 74  Resp: (!) 23  18 18   Temp:  97.7 F (36.5 C)  97.7 F (36.5 C)  TempSrc:      SpO2: 99%  98% 98%  Weight:      Height:        Intake/Output Summary (Last 24 hours) at 11/24/16 0735 Last data filed at 11/24/16 0634  Gross per 24 hour  Intake          2591.34 ml  Output             3750 ml  Net         -1158.66 ml   Filed Weights   11/23/16 0801 11/23/16 1724 11/24/16 0123  Weight: 189 lb (85.7 kg) 188 lb 15 oz (85.7 kg) 202 lb 2.6 oz (91.7 kg)    Telemetry    Sinus rhythm with frequent PACS - Personally Reviewed  ECG    None today   Physical Exam   GEN: No acute distress.   Neck: No JVD Cardiac: RRR, no murmurs, rubs, or gallops. R groin cath site without hematoma, has mild ecchymosis.  Respiratory: Clear to auscultation bilaterally. GI: Soft, nontender, non-distended  MS: No edema; No deformity. Neuro:  Nonfocal  Psych: Normal affect   Labs    Chemistry Recent Labs Lab 11/23/16 0837  NA 138  K 3.6  CL 106  CO2 25    GLUCOSE 60*  BUN 9  CREATININE 0.83  CALCIUM 9.2  GFRNONAA >60  GFRAA >60  ANIONGAP 7     Hematology Recent Labs Lab 11/23/16 0837  WBC 8.9  RBC 5.39  HGB 16.0  HCT 45.1  MCV 83.7  MCH 29.7  MCHC 35.5  RDW 12.3  PLT 314    Cardiac EnzymesNo results for input(s): TROPONINI in the last 168 hours. No results for input(s): TROPIPOC in the last 168 hours.   BNPNo results for input(s): BNP, PROBNP in the last 168 hours.   DDimer No results for input(s): DDIMER in the last 168 hours.   Radiology    No results found.  Cardiac Studies   Atrial Septal Defect(ASD) Closure  Conclusion   Successful transcatheter ASD closure using a 24 mm Amplatzer Septal Occluder under fluoroscopic and intracardiac echo guidance  Plan: observe overnight, limited echo in am, ASA x 6 months, plavix x 3 months.     Patient Profile     26 y.o. male with hx  of type 1 diabetes who recently evaluated  by Dr. Alvino Chapel for atypical chest pain. He underwent an echocardiogram which incidentally demonstrated a dilated right ventricle and right atrium. He underwent a repeat study with gated saline which identified an atrial septal defect primarily with left to right shunting but also with bidirectional flow and positive bubble study. The patient was evaluated by Dr. Excell Seltzer for further evaluation. TEE showed Normal LV function; large secundum ASD with left to right shunt; moderate RAE; mild RVE. He is admitted for ASD closer.   The patient's paternal aunt underwent surgical ASD closure at age 4. He is originally from [place] and plans to return in a few months. He has been here for college.   Assessment & Plan    1. Ostium secundum ASD - Successful transcatheter ASD closure using a 24 mm Amplatzer Septal Occluder under fluoroscopic and intracardiac echo guidance. Limited echo today. If normal --> discharge later today.  - Continue ASA x 6 months, plavix x 3 months.   - Had dizziness over night. BP  of 82/43. Given bolus of fluid. BP soft low. Echo later today.  2. Frequent PACs - He does feels this. Check electrolytes.   Signed, Manson Passey, PA   Personally seen and examined. Agree with above.  ASD closed Aplatzer 24mm  - RRR with occasional ectopy (PAC)  - CTAB, no edema, alert  - ASA Plavix as above  PAC's  - labs look good. K 4.1  Dizzy last night with low BP, possible vagal like phenom.  Feels better but a little nervous Feels PAC's - may be secondary to device and mild inflammatory response.  Bolus.  ECHO is up next. Will read and should be able to go home if stable.  11/24/2016, 7:35 AM

## 2016-11-24 NOTE — Progress Notes (Signed)
Prior to patient ambulating in hallway, orthostatic blood pressures obtained and documented, negative. Upon sitting up in bed, patient states he felt his heart "flutter" PAC's noted on monitor at that time, Dr. Anne FuSkains made aware. Patient proceeds to ambulate in hallway, no dizziness noted, no complaints of dizziness voiced. Gait slow and steady, no shortness of breath noted. No S/S of distress noted or complaints voiced at this time.

## 2016-11-26 ENCOUNTER — Encounter (HOSPITAL_COMMUNITY): Payer: Self-pay | Admitting: Cardiovascular Disease

## 2016-11-26 LAB — GLUCOSE, CAPILLARY: Glucose-Capillary: 219 mg/dL — ABNORMAL HIGH (ref 65–99)

## 2016-11-27 ENCOUNTER — Encounter: Payer: Self-pay | Admitting: Cardiovascular Disease

## 2016-11-27 ENCOUNTER — Telehealth: Payer: Self-pay | Admitting: Cardiovascular Disease

## 2016-11-27 NOTE — Telephone Encounter (Signed)
New message      Pt sister needs a note stating the date and time of her brothers procedure and that she was there with him while he had it done.  email it to : [email or web address]

## 2016-11-27 NOTE — Telephone Encounter (Signed)
New Message   Pt would like to speak to you about the procedure that was done last week, he is having a lot of chest pain when he takes a deep breath   Pt c/o of Chest Pain: STAT if CP now or developed within 24 hours  1. Are you having CP right now? no  2. Are you experiencing any other symptoms (ex. SOB, nausea, vomiting, sweating)? no  3. How long have you been experiencing CP?  Before the procedure   4. Is your CP continuous or coming and going? Only when he takes deep breath   5. Have you taken Nitroglycerin?  no ?

## 2016-11-28 NOTE — Telephone Encounter (Signed)
I attempted to reach the pt but the message states the wireless customer is not available.  I will attempt to reach him again by phone.

## 2016-12-01 ENCOUNTER — Other Ambulatory Visit: Payer: Self-pay | Admitting: Endocrinology

## 2016-12-07 NOTE — Telephone Encounter (Signed)
Late entry:  I communicated with this pt through My Chart.

## 2016-12-10 ENCOUNTER — Telehealth: Payer: Self-pay | Admitting: Endocrinology

## 2016-12-10 ENCOUNTER — Encounter: Payer: Self-pay | Admitting: Cardiovascular Disease

## 2016-12-10 NOTE — Telephone Encounter (Signed)
Patient responded via my chart and requested a referral to be placed for a neurologist. Dr. Lucianne MussKumar is this something you could place for the patient? Thanks!

## 2016-12-10 NOTE — Telephone Encounter (Signed)
I do not see anything regarding a neurology problem

## 2016-12-10 NOTE — Telephone Encounter (Signed)
Patient requested an appointment to see Dr. Everardo AllEllison who is out of the office. Patient states he has a new brown/blue spot on his foot and needs it looked at (patient would not provide additional information.) Call patient to advise, okay to leave a detailed message and message on mychart.

## 2016-12-10 NOTE — Telephone Encounter (Signed)
My chart message submitted to the patient. Waiting on a response.

## 2016-12-11 ENCOUNTER — Telehealth: Payer: Self-pay | Admitting: Cardiovascular Disease

## 2016-12-11 NOTE — Telephone Encounter (Signed)
Dr. Everardo AllEllison, When you can review the messages and please advise on how to proceed. Thanks!

## 2016-12-11 NOTE — Telephone Encounter (Signed)
See message and please advise if you would be willing to see this patient for Dr. Everardo AllEllison. Thanks!

## 2016-12-11 NOTE — Telephone Encounter (Signed)
I spoke with the Kenneth James and he has a knot the size of a green pea at puncture site. The Kenneth James also has green and yellow discoloration at site and I made him aware that this is a bruise that is healing.  The Kenneth James is very nervous about over exerting himself due to taking plavix/ASA and developing an issue with bleeding since his procedure.  The Kenneth James cancelled his Summer classes at [employer] and needs a note from Dr Excell Seltzerooper explaining that he had a cardiac procedure.  I advised him that we can do this letter in regards to limiting exertional activities for the month of June but as of December 23, 2016 he has no restrictions. The Kenneth James would like to pick up letter at the front desk today.

## 2016-12-11 NOTE — Telephone Encounter (Signed)
Patient notified of MD's instruction via mychart.

## 2016-12-11 NOTE — Telephone Encounter (Signed)
He will have to be seen by primary care or dermatologist

## 2016-12-11 NOTE — Telephone Encounter (Signed)
Patient calling in regards to email he sent via MyChart. Patient states that he needs letter for university. Thanks.

## 2016-12-11 NOTE — Telephone Encounter (Signed)
Please see if another provider can see him.  can another site see him?

## 2016-12-12 ENCOUNTER — Ambulatory Visit (INDEPENDENT_AMBULATORY_CARE_PROVIDER_SITE_OTHER): Payer: PPO | Admitting: Family Medicine

## 2016-12-12 ENCOUNTER — Encounter: Payer: Self-pay | Admitting: Family Medicine

## 2016-12-12 DIAGNOSIS — S9030XA Contusion of unspecified foot, initial encounter: Secondary | ICD-10-CM | POA: Diagnosis not present

## 2016-12-12 NOTE — Telephone Encounter (Signed)
I attempted to contach the patient. No answer. No vm available. Dr Earlene PlaterWallace did offer to see the patient as a new patient.

## 2016-12-12 NOTE — Progress Notes (Signed)
Kenneth James is a 26 y.o. male is here to [facility].   No care team member to display   History of Present Illness:   Kenneth James CMA acting as scribe for Dr. Earlene Plater.  HPI Patient comes in today to establish care. He has blue spots on both feet.   Health Maintenance Due  Topic Date Due  . PNEUMOCOCCAL POLYSACCHARIDE VACCINE (1) 04/15/1994  . OPHTHALMOLOGY EXAM  04/15/2002  . HIV Screening  04/16/2007  . TETANUS/TDAP  04/16/2011    PMHx, SurgHx, SocialHx, Medications, and Allergies were reviewed in the Visit Navigator and updated as appropriate.   Past Medical History:  Diagnosis Date  . Diabetes mellitus without complication [facility])     Past Surgical History:  Procedure Laterality Date  . ATRIAL SEPTAL DEFECT(ASD) CLOSURE N/A 11/23/2016   Procedure: Atrial Septal Defect(ASD) Closure;  Surgeon: Tonny Bollman, MD;  Location: [facility] INVASIVE CV LAB;  Service: Cardiovascular;  Laterality: N/A;  . TEE WITHOUT CARDIOVERSION N/A 11/06/2016   Procedure: TRANSESOPHAGEAL ECHOCARDIOGRAM (TEE);  Surgeon: Lewayne Bunting, MD;  Location: [facility] ENDOSCOPY;  Service: Cardiovascular;  Laterality: N/A;    Family History  Problem Relation Age of Onset  . Diabetes Other   . Diabetes Sister   . Diabetes Paternal Grandfather    Social History  Substance Use Topics  . Smoking status: Former Smoker    Packs/day: 1.00    Years: 5.00    Types: Cigarettes    Quit date: 05/25/2014  . Smokeless tobacco: Never Used  . Alcohol use No    Current Medications and Allergies:   Current Outpatient Prescriptions:  .  aspirin EC 81 MG tablet, Take 1 tablet (81 mg total) by mouth daily. (Patient taking differently: Take 81 mg by mouth at bedtime. ), Disp: , Rfl:  .  Cholecalciferol (VITAMIN D3) 5000 units CAPS, Take 5,000 Units by mouth once a week., Disp: , Rfl:  .  clopidogrel (PLAVIX) 75 MG tablet, Take 1 tablet (75 mg total) by mouth daily. (Patient taking differently: Take 75 mg by mouth  daily at 3 pm. ), Disp: 30 tablet, Rfl: 6 .  insulin NPH-regular Human (HUMULIN 70/30) (70-30) 100 UNIT/ML injection, 28 units with breakfast, and 22 units with the evening meal, and syringes 2/day (Patient taking differently: 30 units with breakfast, and 20 units with the evening meal, and syringes 2/day), Disp: 20 mL, Rfl: 11 .  ONE TOUCH ULTRA TEST test strip, TEST 7 TIMES PER DAY, AND LANCETS 7/DAY, Disp: 630 each, Rfl: 2  No Known Allergies   Review of Systems:   Review of Systems  Constitutional: Negative for chills, fever and malaise/fatigue.  HENT: Negative for ear pain, sinus pain and sore throat.   Eyes: Negative for blurred vision and double vision.  Respiratory: Negative for cough, shortness of breath and wheezing.   Cardiovascular: Negative for chest pain, palpitations and leg swelling.  Gastrointestinal: Negative for abdominal pain, nausea and vomiting.  Musculoskeletal: Negative for back pain, joint pain and neck pain.  Neurological: Negative for dizziness and headaches.  Psychiatric/Behavioral: Negative for depression, hallucinations and memory loss.    Vitals:   Vitals:   12/12/16 1300  BP: 112/64  Pulse: 76  Temp: 97.6 F (36.4 C)  TempSrc: Oral  SpO2: 97%  Weight: 193 lb 12.8 oz (87.9 kg)  Height: 5\' 7"  (1.702 m)     Body mass index is 30.35 kg/m.  Physical Exam:   Physical Exam  Constitutional: He is oriented to person,  place, and time. He appears well-developed and well-nourished. No distress.  HENT:  Head: Normocephalic and atraumatic.  Right Ear: External ear normal.  Left Ear: External ear normal.  Nose: Nose normal.  Mouth/Throat: Oropharynx is clear and moist.  Eyes: Conjunctivae and EOM are normal. Pupils are equal, round, and reactive to light.  Neck: Normal range of motion. Neck supple.  Cardiovascular: Normal rate, regular rhythm, normal heart sounds and intact distal pulses.   Pulmonary/Chest: Effort normal and breath sounds normal.    Abdominal: Soft. Bowel sounds are normal.  Musculoskeletal: Normal range of motion.  Neurological: He is alert and oriented to person, place, and time.  Skin: Skin is warm and dry.  Psychiatric: He has a normal mood and affect. His behavior is normal. Judgment and thought content normal.  Nursing note and vitals reviewed.  Diabetic Foot Exam - Simple   Simple Foot Form Diabetic Foot exam was performed with the following findings:  Yes 12/12/2016  4:42 PM  Visual Inspection See comments:  Yes Sensation Testing Intact to touch and monofilament testing bilaterally:  Yes Pulse Check Posterior Tibialis and Dorsalis pulse intact bilaterally:  Yes Comments Symmetric bilateral contusions to dorsum midfeet.     Results for orders placed or performed during the hospital encounter of 11/23/16  Glucose, capillary  Result Value Ref Range   Glucose-Capillary 68 65 - 99 mg/dL  Basic metabolic panel  Result Value Ref Range   Sodium 138 135 - 145 mmol/L   Potassium 3.6 3.5 - 5.1 mmol/L   Chloride 106 101 - 111 mmol/L   CO2 25 22 - 32 mmol/L   Glucose, Bld 60 (L) 65 - 99 mg/dL   BUN 9 6 - 20 mg/dL   Creatinine, Ser 9.560.83 0.61 - 1.24 mg/dL   Calcium 9.2 8.9 - 21.310.3 mg/dL   GFR calc non Af Amer >60 >60 mL/min   GFR calc Af Amer >60 >60 mL/min   Anion gap 7 5 - 15  Protime-INR  Result Value Ref Range   Prothrombin Time 13.3 11.4 - 15.2 seconds   INR 1.01   CBC  Result Value Ref Range   WBC 8.9 4.0 - 10.5 K/uL   RBC 5.39 4.22 - 5.81 MIL/uL   Hemoglobin 16.0 13.0 - 17.0 g/dL   HCT 08.645.1 57.839.0 - 46.952.0 %   MCV 83.7 78.0 - 100.0 fL   MCH 29.7 26.0 - 34.0 pg   MCHC 35.5 30.0 - 36.0 g/dL   RDW 62.912.3 52.811.5 - 41.315.5 %   Platelets 314 150 - 400 K/uL    Assessment and Plan:   Kenneth James was seen today for establish care.  Diagnoses and all orders for this visit:  Contusion of foot, unspecified laterality, initial encounter Comments: Areas are symmetric, without ttp. Consistent with mechanical  irritation. No red flags. Reviewed proper shoe-wear.    . Reviewed expectations re: course of current medical issues. . Discussed self-management of symptoms. . Outlined signs and symptoms indicating need for more acute intervention. . Patient verbalized understanding and all questions were answered. Marland Kitchen. Health Maintenance issues including appropriate healthy diet, exercise, and smoking avoidance were discussed with patient. . See orders for this visit as documented in the electronic medical record. . Patient received an After Visit Summary.  CMA served as Neurosurgeonscribe during this visit. History, Physical, and Plan performed by medical provider. The above documentation has been reviewed and is accurate and complete. Helane RimaErica Miranda Frese, D.O.   Helane RimaErica Chandlor Noecker, DO [place], [facility]  12/13/2016  Future Appointments Date Time Provider Department Center  12/20/2016 10:30 AM Manson Passey, [place] [facility]  01/17/2017 11:00 AM Romero Belling, MD [facility] None

## 2016-12-12 NOTE — Telephone Encounter (Signed)
[facility] called back. [facility]

## 2016-12-14 ENCOUNTER — Other Ambulatory Visit: Payer: Self-pay | Admitting: Endocrinology

## 2016-12-17 ENCOUNTER — Encounter: Payer: Self-pay | Admitting: Physician Assistant

## 2016-12-17 NOTE — Progress Notes (Signed)
Cardiology Office Note    Date:  12/20/2016   ID:  Kenneth BondMohammed James, DOB Mar 06, 1991, MRN [id number]  PCP:  Patient, No Pcp Per  Cardiologist:  Dr. Excell Seltzerooper  Chief Complaint: ASD closer  History of Present Illness:   Kenneth James is a 26 y.o. male with type 1 DM who presents for ASD closer follow up.   He was recently evaluated by Dr. Alvino ChapelIngal for atypical chest pain. He underwent an echocardiogram which incidentally demonstrated a dilated right ventricle and right atrium. He underwent a repeat study with gated saline which identified an atrial septal defect primarily with left to right shunting but also with bidirectional flow and positive bubble study. Successful transcatheter ASD closure using a 24 mm Amplatzer Septal Occluder under fluoroscopic and intracardiac echo guidance 11/23/16. He had frequent PACs on telemetry during admission.   Here today for follow up. The patient denies nausea, vomiting, fever, chest pain, palpitations, shortness of breath, orthopnea, PND, dizziness, syncope, cough, congestion, abdominal pain, hematochezia, melena, lower extremity edema. He plays football. No bleeding or sign of infection at cath site on r groin.    Past Medical History:  Diagnosis Date  . ASD (atrial septal defect), ostium secundum    s/p closer 11/23/16  . Diabetes mellitus without complication [facility](HCC)     Past Surgical History:  Procedure Laterality Date  . ATRIAL SEPTAL DEFECT(ASD) CLOSURE N/A 11/23/2016   Procedure: Atrial Septal Defect(ASD) Closure;  Surgeon: Tonny Bollmanooper, Michael, MD;  [facility];  Service: Cardiovascular;  Laterality: N/A;  . TEE WITHOUT CARDIOVERSION N/A 11/06/2016   Procedure: TRANSESOPHAGEAL ECHOCARDIOGRAM (TEE);  Surgeon: Lewayne Buntingrenshaw, Brian S, MD;  [facility];  Service: Cardiovascular;  Laterality: N/A;    Current Medications: Prior to Admission medications   Medication Sig Start Date End Date Taking? Authorizing Provider  aspirin EC 81  MG tablet Take 1 tablet (81 mg total) by mouth daily. Patient taking differently: Take 81 mg by mouth at bedtime.  11/05/16   Tonny Bollmanooper, Michael, MD  Cholecalciferol (VITAMIN D3) 5000 units CAPS Take 5,000 Units by mouth once a week.    [provider]  clopidogrel (PLAVIX) 75 MG tablet Take 1 tablet (75 mg total) by mouth daily. Patient taking differently: Take 75 mg by mouth daily at 3 pm.  11/08/16 11/08/17  Tonny Bollmanooper, Michael, MD  insulin NPH-regular Human (HUMULIN 70/30) (70-30) 100 UNIT/ML injection 28 units with breakfast, and 22 units with the evening meal, and syringes 2/day Patient taking differently: 30 units with breakfast, and 20 units with the evening meal, and syringes 2/day 10/30/16   Romero BellingEllison, Sean, MD  ONE TOUCH ULTRA TEST test strip TEST 7 TIMES PER DAY, AND LANCETS 7/DAY 12/01/16   Romero BellingEllison, Sean, MD    Allergies:   Patient has no known allergies.   Social History   Social History  . Marital status: Single    Spouse name: N/A  . Number of children: N/A  . Years of education: N/A   Social History Main Topics  . Smoking status: Former Smoker    Packs/day: 1.00    Years: 5.00    Types: Cigarettes    Quit date: 05/25/2014  . Smokeless tobacco: Never Used  . Alcohol use No  . Drug use: No  . Sexual activity: Not Asked   Other Topics Concern  . None   Social History Narrative  . None     Family History:  The patient's family history includes Diabetes in his other, paternal grandfather,  and sister.  ROS:   Please see the history of present illness.    ROS All other systems reviewed and are negative.   PHYSICAL EXAM:   VS:  BP (!) 102/50   Pulse 64   Ht 5\' 7"  (1.702 m)   Wt 193 lb 12.8 oz (87.9 kg)   BMI 30.35 kg/m    GEN: Well nourished, well developed, in no acute distress  HEENT: normal  Neck: no JVD, carotid bruits, or masses Cardiac: RRR; no murmurs, rubs, or gallops, no edema  Respiratory:  clear to auscultation bilaterally, normal work of  breathing GI: soft, nontender, nondistended, + BS MS: no deformity or atrophy  Skin: warm and dry, no rash Neuro:  Alert and Oriented x 3, Strength and sensation are intact Psych: euthymic mood, full affect  Wt Readings from Last 3 Encounters:  12/20/16 193 lb 12.8 oz (87.9 kg)  12/12/16 193 lb 12.8 oz (87.9 kg)  11/24/16 202 lb 2.6 oz (91.7 kg)      Studies/Labs Reviewed:   EKG:  EKG is not  ordered today.    Recent Labs: 08/17/2016: TSH 1.16 10/30/2016: ALT 15 11/23/2016: Hemoglobin 16.0; Platelets 314 11/24/2016: BUN 7; Creatinine, Ser 0.83; Magnesium 1.9; Potassium 4.1; Sodium 136   Lipid Panel    Component Value Date/Time   CHOL 114 08/17/2016 1455   TRIG 43.0 08/17/2016 1455   HDL 44.40 08/17/2016 1455   CHOLHDL 3 08/17/2016 1455   VLDL 8.6 08/17/2016 1455   LDLCALC 61 08/17/2016 1455    Additional studies/ records that were reviewed today include:   Atrial Septal Defect(ASD) Closure 11/23/16  Conclusion   Successful transcatheter ASD closure using a 24 mm Amplatzer Septal Occluder under fluoroscopic and intracardiac echo guidance  Plan: observe overnight, limited echo in am, ASA x 6 months, plavix x 3 months.    Echo 11/24/16 Study Conclusions  - Left ventricle: The cavity size was normal. Systolic function was normal. The estimated ejection fraction was in the range of 60% to 65%. Wall motion was normal; there were no regional wall motion abnormalities. Left ventricular diastolic function parameters were normal. - Atrial septum: An Amplatzer closure device was present and well seated. There was no atrial level shunt.   ASSESSMENT & PLAN:    1. Ostium secundum ASD s/p  transcatheter ASD closure using a 24 mm Amplatzer Septal Occluder  - No dizziness, syncope and shortness of breath. Continue  ASA x 6 months, plavix x 3 months. Gradually increase activity.    He is going back to [place] first week of August 2018. Advised to get medical  records and established care with cardiologist over there. I will discuss with Dr. Excell Seltzer if he needs follow-up before he goes back to [place].   Medication Adjustments/Labs and Tests Ordered: Current medicines are reviewed at length with the patient today.  Concerns regarding medicines are outlined above.  Medication changes, Labs and Tests ordered today are listed in the Patient Instructions below. Patient Instructions  Your physician recommends that you continue on your current medications as directed. Please refer to the Current Medication list given to you today. Your physician recommends that you schedule a follow-up appointment in:  WILL  C  ALL PT  WITH APPT     Lorelei Pont, PA  12/20/2016 11:33 AM    [facility] [address] [place], [place], [place]  [address] Phone: [phone]; Fax: [phone]

## 2016-12-20 ENCOUNTER — Encounter: Payer: Self-pay | Admitting: Physician Assistant

## 2016-12-20 ENCOUNTER — Ambulatory Visit (INDEPENDENT_AMBULATORY_CARE_PROVIDER_SITE_OTHER): Payer: PPO | Admitting: Physician Assistant

## 2016-12-20 VITALS — BP 102/50 | HR 64 | Ht 67.0 in | Wt 193.8 lb

## 2016-12-20 DIAGNOSIS — Q211 Atrial septal defect, unspecified: Secondary | ICD-10-CM

## 2016-12-20 NOTE — Patient Instructions (Addendum)
Your physician recommends that you continue on your current medications as directed. Please refer to the Current Medication list given to you today. Your physician recommends that you schedule a follow-up appointment in:  WILL  C  ALL PT  WITH APPT

## 2016-12-20 NOTE — Progress Notes (Signed)
Sounds like he's doing well. Should be fine to FU when [place] returns home to [place]. He should have a 6 month echo study. thanks

## 2016-12-21 ENCOUNTER — Encounter: Payer: Self-pay | Admitting: Cardiovascular Disease

## 2016-12-21 ENCOUNTER — Encounter: Payer: Self-pay | Admitting: Physician Assistant

## 2016-12-24 ENCOUNTER — Telehealth: Payer: Self-pay | Admitting: Endocrinology

## 2016-12-24 NOTE — Telephone Encounter (Signed)
[employer] called to get the last office note faxed to [phone] for patient to get insulin pump.

## 2016-12-24 NOTE — Telephone Encounter (Signed)
Office note submitted

## 2016-12-28 ENCOUNTER — Observation Stay (HOSPITAL_COMMUNITY)
Admission: EM | Admit: 2016-12-28 | Discharge: 2016-12-30 | Disposition: A | Discharge status: Home / Self Care | Payer: PPO | Attending: Family Medicine | Admitting: Family Medicine

## 2016-12-28 ENCOUNTER — Emergency Department (HOSPITAL_COMMUNITY): Payer: PPO

## 2016-12-28 DIAGNOSIS — R402412 Glasgow coma scale score 13-15, at arrival to emergency department: Secondary | ICD-10-CM | POA: Diagnosis not present

## 2016-12-28 DIAGNOSIS — R6510 Systemic inflammatory response syndrome (SIRS) of non-infectious origin without acute organ dysfunction: Secondary | ICD-10-CM | POA: Diagnosis present

## 2016-12-28 DIAGNOSIS — Z7902 Long term (current) use of antithrombotics/antiplatelets: Secondary | ICD-10-CM | POA: Insufficient documentation

## 2016-12-28 DIAGNOSIS — Z7982 Long term (current) use of aspirin: Secondary | ICD-10-CM | POA: Diagnosis not present

## 2016-12-28 DIAGNOSIS — G9341 Metabolic encephalopathy: Secondary | ICD-10-CM | POA: Insufficient documentation

## 2016-12-28 DIAGNOSIS — R4182 Altered mental status, unspecified: Secondary | ICD-10-CM

## 2016-12-28 DIAGNOSIS — E10649 Type 1 diabetes mellitus with hypoglycemia without coma: Principal | ICD-10-CM | POA: Insufficient documentation

## 2016-12-28 DIAGNOSIS — Z794 Long term (current) use of insulin: Secondary | ICD-10-CM | POA: Diagnosis not present

## 2016-12-28 DIAGNOSIS — Z9889 Other specified postprocedural states: Secondary | ICD-10-CM | POA: Diagnosis not present

## 2016-12-28 DIAGNOSIS — Z8774 Personal history of (corrected) congenital malformations of heart and circulatory system: Secondary | ICD-10-CM

## 2016-12-28 DIAGNOSIS — E109 Type 1 diabetes mellitus without complications: Secondary | ICD-10-CM | POA: Diagnosis present

## 2016-12-28 HISTORY — DX: Atrial septal defect, unspecified: Q21.10

## 2016-12-28 HISTORY — DX: Atrial septal defect: Q21.1

## 2016-12-28 LAB — COMPREHENSIVE METABOLIC PANEL
ALBUMIN: 4.4 g/dL (ref 3.5–5.0)
ALK PHOS: 78 U/L (ref 38–126)
ALT: 32 U/L (ref 17–63)
ANION GAP: 10 (ref 5–15)
AST: 38 U/L (ref 15–41)
BILIRUBIN TOTAL: 0.7 mg/dL (ref 0.3–1.2)
BUN: 9 mg/dL (ref 6–20)
CO2: 25 mmol/L (ref 22–32)
Calcium: 9.2 mg/dL (ref 8.9–10.3)
Chloride: 101 mmol/L (ref 101–111)
Creatinine, Ser: 0.74 mg/dL (ref 0.61–1.24)
GFR calc Af Amer: >60 mL/min (ref >60)
GFR calc non Af Amer: >60 mL/min (ref >60)
GLUCOSE: 213 mg/dL — AB (ref 65–99)
Potassium: 3.8 mmol/L (ref 3.5–5.1)
Sodium: 136 mmol/L (ref 135–145)
TOTAL PROTEIN: 7.6 g/dL (ref 6.5–8.1)

## 2016-12-28 LAB — CBC WITH DIFFERENTIAL/PLATELET
BASOS PCT: 0 %
Basophils Absolute: 0 10*3/uL (ref 0.0–0.1)
Eosinophils Absolute: 0 10*3/uL (ref 0.0–0.7)
Eosinophils Relative: 0 %
HEMATOCRIT: 40.5 % (ref 39.0–52.0)
HEMOGLOBIN: 14.3 g/dL (ref 13.0–17.0)
LYMPHS ABS: 0.7 10*3/uL (ref 0.7–4.0)
Lymphocytes Relative: 5 %
MCH: 29.9 pg (ref 26.0–34.0)
MCHC: 35.3 g/dL (ref 30.0–36.0)
MCV: 84.6 fL (ref 78.0–100.0)
MONOS PCT: 2 %
Monocytes Absolute: 0.3 10*3/uL (ref 0.1–1.0)
NEUTROS ABS: 12.1 10*3/uL — AB (ref 1.7–7.7)
NEUTROS PCT: 93 %
Platelets: 311 10*3/uL (ref 150–400)
RBC: 4.79 MIL/uL (ref 4.22–5.81)
RDW: 12.5 % (ref 11.5–15.5)
WBC: 13 10*3/uL — ABNORMAL HIGH (ref 4.0–10.5)

## 2016-12-28 LAB — RAPID URINE DRUG SCREEN, HOSP PERFORMED
Amphetamines: NOT DETECTED
BENZODIAZEPINES: NOT DETECTED
Barbiturates: NOT DETECTED
Cocaine: NOT DETECTED
Opiates: NOT DETECTED
Tetrahydrocannabinol: NOT DETECTED

## 2016-12-28 LAB — BLOOD GAS, VENOUS
ACID-BASE DEFICIT: 0.2 mmol/L (ref 0.0–2.0)
Bicarbonate: 23.9 mmol/L (ref 20.0–28.0)
O2 SAT: 57.1 %
PATIENT TEMPERATURE: 98.6
PCO2 VEN: 39.3 mmHg — AB (ref 44.0–60.0)
pH, Ven: 7.402 (ref 7.250–7.430)

## 2016-12-28 LAB — ACETAMINOPHEN LEVEL: Acetaminophen (Tylenol), Serum: <10 ug/mL — ABNORMAL LOW (ref 10–30)

## 2016-12-28 LAB — ETHANOL

## 2016-12-28 LAB — SALICYLATE LEVEL: Salicylate Lvl: <7 mg/dL (ref 2.8–30.0)

## 2016-12-28 LAB — CBG MONITORING, ED: GLUCOSE-CAPILLARY: 198 mg/dL — AB (ref 65–99)

## 2016-12-28 LAB — I-STAT CG4 LACTIC ACID, ED: Lactic Acid, Venous: 1 mmol/L (ref 0.5–1.9)

## 2016-12-28 MED ORDER — LORAZEPAM 2 MG/ML IJ SOLN
1.0000 mg | Freq: Once | INTRAMUSCULAR | Status: AC
  Administered 2016-12-28: 1 mg via INTRAVENOUS
  Filled 2016-12-28: qty 1

## 2016-12-28 MED ORDER — SODIUM CHLORIDE 0.9 % IV SOLN
INTRAVENOUS | Status: DC
  Administered 2016-12-28: 19:00:00 via INTRAVENOUS

## 2016-12-28 MED ORDER — SODIUM CHLORIDE 0.9 % IV BOLUS (SEPSIS)
2000.0000 mL | Freq: Once | INTRAVENOUS | Status: AC
  Administered 2016-12-28: 2000 mL via INTRAVENOUS

## 2016-12-28 MED ORDER — SODIUM CHLORIDE 0.9 % IV SOLN
INTRAVENOUS | Status: DC
  Administered 2016-12-28: 20:00:00 via INTRAVENOUS

## 2016-12-28 NOTE — ED Triage Notes (Signed)
Patient came to registration window and repeatedly stated, "I can not breath." Once in triage, Patient repeated the same. Patient stated yes when asked if he could speak [place]. When asked for his name, patient could only say Kenneth James and could or would not elaborate. Patient repeatedly states, :"Listen" patient could not write his birthday or name on paper. Patient  Tearful and stating he can not breathe.  Unable to get any further information.

## 2016-12-28 NOTE — ED Provider Notes (Signed)
[facility]-EMERGENCY DEPT Provider Note   CSN: [id number] Arrival date & time: 12/28/16  1738     History   Chief Complaint Chief Complaint  Patient presents with  . Shortness of Breath  . Altered Mental Status    HPI Kenneth James is a 26 y.o. male.  Patient presents with complaint of shortness of breath and altered mental status. Attempted to obtain history but unsuccessful and felt there was a possible language barrier. Believe that the patient spoke Arabic and use the video interpreter and the patient seemed to understand Arabic but only answer in [place]. Patient repeatedly says that he is short of breath and also has elevated blood sugar. When further questioned about the type of diabetes he has patient continues only say he is short of breath. No further history obtainable      No past medical history on file.  There are no active problems to display for this patient.   No past surgical history on file.     Home Medications    Prior to Admission medications   Not on File    Family History No family history on file.  Social History Social History  Substance Use Topics  . Smoking status: Not on file  . Smokeless tobacco: Not on file  . Alcohol use Not on file     Allergies   Patient has no allergy information on record.   Review of Systems Review of Systems  Unable to perform ROS: Acuity of condition     Physical Exam Updated Vital Signs BP (!) 141/111 (BP Location: Right Arm)   Pulse (!) 111   Temp 98 F (36.7 C) (Oral)   Resp 19   SpO2 100%   Physical Exam  Constitutional: He appears well-developed and well-nourished.  Non-toxic appearance. No distress.  HENT:  Head: Normocephalic and atraumatic.  Eyes: Conjunctivae, EOM and lids are normal. Pupils are equal, round, and reactive to light.  Neck: Normal range of motion. Neck supple. No tracheal deviation present. No thyroid mass present.  Cardiovascular: Regular rhythm and normal heart  sounds.  Tachycardia present.  Exam reveals no gallop.   No murmur heard. Pulmonary/Chest: Effort normal and breath sounds normal. No stridor. No respiratory distress. He has no decreased breath sounds. He has no wheezes. He has no rhonchi. He has no rales.  Abdominal: Soft. Normal appearance and bowel sounds are normal. He exhibits no distension. There is no tenderness. There is no rebound and no CVA tenderness.  Musculoskeletal: Normal range of motion. He exhibits no edema or tenderness.  Neurological: He is alert. He has normal strength. No cranial nerve deficit or sensory deficit. GCS eye subscore is 4. GCS verbal subscore is 4. GCS motor subscore is 6.  Skin: Skin is warm and dry. No abrasion and no rash noted.  Psychiatric: His mood appears anxious. His affect is inappropriate. His speech is rapid and/or pressured. He is agitated. He is inattentive.  Nursing note and vitals reviewed.    ED Treatments / Results  Labs (all labs ordered are listed, but only abnormal results are displayed) Labs Reviewed  ETHANOL  RAPID URINE DRUG SCREEN, HOSP PERFORMED  SALICYLATE LEVEL  ACETAMINOPHEN LEVEL  CBC WITH DIFFERENTIAL/PLATELET  COMPREHENSIVE METABOLIC PANEL  BLOOD GAS, ARTERIAL    EKG  EKG Interpretation None       Radiology No results found.  Procedures Procedures (including critical care time)  Medications Ordered in ED Medications  0.9 %  sodium chloride infusion (not  administered)     Initial Impression / Assessment and Plan / ED Course  I have reviewed the triage vital signs and the nursing notes.  Pertinent labs & imaging results that were available during my care of the patient were reviewed by me and considered in my medical decision making (see chart for details).    Attempted to speak with patient's multiple times using video interpreter but only received repetitive answers that he was short of breath. He then to me that he felt his blood sugar was elevated.  Blood sugar was 198. His ABG showed no evidence of DKA. Treated with IV fluids. Head CT negative. Drug screen negative. Patient given Ativan and improved with that. Patient's rectal temperature was 100. Blood cultures obtained. Lactate is pending at this time as well as a CK. Patient moves his neck freely in his mental status is greatly improved. Low suspicion for meningitis. Discuss with Dr. Toniann FailKakrakandy who will admit the patient  CRITICAL CARE Performed by: Lorre NickALLEN,Pennye Beeghly T Total critical care time: 75 minutes Critical care time was exclusive of separately billable procedures and treating other patients. Critical care was necessary to treat or prevent imminent or life-threatening deterioration. Critical care was time spent personally by me on the following activities: development of treatment plan with patient and/or surrogate as well as nursing, discussions with consultants, evaluation of patient's response to treatment, examination of patient, obtaining history from patient or surrogate, ordering and performing treatments and interventions, ordering and review of laboratory studies, ordering and review of radiographic studies, pulse oximetry and re-evaluation of patient's condition.   Final Clinical Impressions(s) / ED Diagnoses   Final diagnoses:  None    New Prescriptions New Prescriptions   No medications on file     Lorre NickAllen, Oluwadamilola Rosamond, MD 12/28/16 2309

## 2016-12-29 ENCOUNTER — Encounter (HOSPITAL_COMMUNITY): Payer: Self-pay | Admitting: Internal Medicine

## 2016-12-29 DIAGNOSIS — Z8774 Personal history of (corrected) congenital malformations of heart and circulatory system: Secondary | ICD-10-CM

## 2016-12-29 DIAGNOSIS — E109 Type 1 diabetes mellitus without complications: Secondary | ICD-10-CM | POA: Diagnosis present

## 2016-12-29 DIAGNOSIS — G9341 Metabolic encephalopathy: Secondary | ICD-10-CM

## 2016-12-29 DIAGNOSIS — R6510 Systemic inflammatory response syndrome (SIRS) of non-infectious origin without acute organ dysfunction: Secondary | ICD-10-CM | POA: Diagnosis not present

## 2016-12-29 DIAGNOSIS — E162 Hypoglycemia, unspecified: Secondary | ICD-10-CM | POA: Diagnosis not present

## 2016-12-29 LAB — COMPREHENSIVE METABOLIC PANEL
ALK PHOS: 59 U/L (ref 38–126)
ALT: 21 U/L (ref 17–63)
AST: 27 U/L (ref 15–41)
Albumin: 3.3 g/dL — ABNORMAL LOW (ref 3.5–5.0)
Anion gap: 8 (ref 5–15)
BUN: 9 mg/dL (ref 6–20)
CALCIUM: 8.3 mg/dL — AB (ref 8.9–10.3)
CO2: 20 mmol/L — ABNORMAL LOW (ref 22–32)
CREATININE: 0.77 mg/dL (ref 0.61–1.24)
Chloride: 105 mmol/L (ref 101–111)
Glucose, Bld: 364 mg/dL — ABNORMAL HIGH (ref 65–99)
Potassium: 4.2 mmol/L (ref 3.5–5.1)
Sodium: 133 mmol/L — ABNORMAL LOW (ref 135–145)
Total Bilirubin: 1.3 mg/dL — ABNORMAL HIGH (ref 0.3–1.2)
Total Protein: 5.9 g/dL — ABNORMAL LOW (ref 6.5–8.1)

## 2016-12-29 LAB — CBC WITH DIFFERENTIAL/PLATELET
BASOS PCT: 0 %
Basophils Absolute: 0 10*3/uL (ref 0.0–0.1)
EOS ABS: 0.1 10*3/uL (ref 0.0–0.7)
Eosinophils Relative: 1 %
HCT: 36.7 % — ABNORMAL LOW (ref 39.0–52.0)
HEMOGLOBIN: 12.7 g/dL — AB (ref 13.0–17.0)
Lymphocytes Relative: 20 %
Lymphs Abs: 1.7 10*3/uL (ref 0.7–4.0)
MCH: 29.3 pg (ref 26.0–34.0)
MCHC: 34.6 g/dL (ref 30.0–36.0)
MCV: 84.8 fL (ref 78.0–100.0)
MONOS PCT: 7 %
Monocytes Absolute: 0.6 10*3/uL (ref 0.1–1.0)
NEUTROS PCT: 72 %
Neutro Abs: 6.2 10*3/uL (ref 1.7–7.7)
PLATELETS: 259 10*3/uL (ref 150–400)
RBC: 4.33 MIL/uL (ref 4.22–5.81)
RDW: 12.7 % (ref 11.5–15.5)
WBC: 8.5 10*3/uL (ref 4.0–10.5)

## 2016-12-29 LAB — URINALYSIS, ROUTINE W REFLEX MICROSCOPIC
BACTERIA UA: NONE SEEN
BILIRUBIN URINE: NEGATIVE
Glucose, UA: >=500 mg/dL — AB
Hgb urine dipstick: NEGATIVE
Ketones, ur: 80 mg/dL — AB
Leukocytes, UA: NEGATIVE
NITRITE: NEGATIVE
PH: 6 (ref 5.0–8.0)
Protein, ur: NEGATIVE mg/dL
RBC / HPF: NONE SEEN RBC/hpf (ref 0–5)
SPECIFIC GRAVITY, URINE: 1.024 (ref 1.005–1.030)
SQUAMOUS EPITHELIAL / LPF: NONE SEEN
WBC, UA: NONE SEEN WBC/hpf (ref 0–5)

## 2016-12-29 LAB — GLUCOSE, CAPILLARY
GLUCOSE-CAPILLARY: 178 mg/dL — AB (ref 65–99)
GLUCOSE-CAPILLARY: 212 mg/dL — AB (ref 65–99)
GLUCOSE-CAPILLARY: 234 mg/dL — AB (ref 65–99)
GLUCOSE-CAPILLARY: 241 mg/dL — AB (ref 65–99)
GLUCOSE-CAPILLARY: 300 mg/dL — AB (ref 65–99)
Glucose-Capillary: 344 mg/dL — ABNORMAL HIGH (ref 65–99)

## 2016-12-29 LAB — AMMONIA: Ammonia: 15 umol/L (ref 9–35)

## 2016-12-29 LAB — SEDIMENTATION RATE: Sed Rate: 3 mm/hr (ref 0–16)

## 2016-12-29 LAB — HIV ANTIBODY (ROUTINE TESTING W REFLEX): HIV Screen 4th Generation wRfx: NONREACTIVE

## 2016-12-29 LAB — PROCALCITONIN: Procalcitonin: <0.1 ng/mL

## 2016-12-29 LAB — CK: CK TOTAL: 189 U/L (ref 49–397)

## 2016-12-29 LAB — CBG MONITORING, ED: GLUCOSE-CAPILLARY: 259 mg/dL — AB (ref 65–99)

## 2016-12-29 LAB — TSH: TSH: 0.739 u[IU]/mL (ref 0.350–4.500)

## 2016-12-29 LAB — T4, FREE: FREE T4: 0.89 ng/dL (ref 0.61–1.12)

## 2016-12-29 MED ORDER — ASPIRIN EC 81 MG PO TBEC
81.0000 mg | DELAYED_RELEASE_TABLET | Freq: Every day | ORAL | Status: DC
  Administered 2016-12-29 – 2016-12-30 (×2): 81 mg via ORAL
  Filled 2016-12-29 (×2): qty 1

## 2016-12-29 MED ORDER — DOXYCYCLINE HYCLATE 100 MG IV SOLR
100.0000 mg | Freq: Two times a day (BID) | INTRAVENOUS | Status: DC
  Administered 2016-12-29 (×3): 100 mg via INTRAVENOUS
  Filled 2016-12-29 (×4): qty 100

## 2016-12-29 MED ORDER — ACETAMINOPHEN 325 MG PO TABS: 650.0000 mg | ORAL_TABLET | Freq: Four times a day (QID) | ORAL | Status: DC | PRN

## 2016-12-29 MED ORDER — INSULIN ASPART 100 UNIT/ML ~~LOC~~ SOLN
0.0000 [IU] | Freq: Three times a day (TID) | SUBCUTANEOUS | Status: DC
  Administered 2016-12-29: 3 [IU] via SUBCUTANEOUS
  Administered 2016-12-29: 5 [IU] via SUBCUTANEOUS
  Administered 2016-12-29: 7 [IU] via SUBCUTANEOUS
  Administered 2016-12-30: 3 [IU] via SUBCUTANEOUS
  Administered 2016-12-30: 5 [IU] via SUBCUTANEOUS

## 2016-12-29 MED ORDER — CLOPIDOGREL BISULFATE 75 MG PO TABS
75.0000 mg | ORAL_TABLET | Freq: Every day | ORAL | Status: DC
  Administered 2016-12-29 – 2016-12-30 (×2): 75 mg via ORAL
  Filled 2016-12-29 (×2): qty 1

## 2016-12-29 MED ORDER — PIPERACILLIN-TAZOBACTAM 3.375 G IVPB
3.3750 g | Freq: Three times a day (TID) | INTRAVENOUS | Status: DC
  Administered 2016-12-29 – 2016-12-30 (×3): 3.375 g via INTRAVENOUS
  Filled 2016-12-29 (×4): qty 50

## 2016-12-29 MED ORDER — VANCOMYCIN HCL IN DEXTROSE 1-5 GM/200ML-% IV SOLN
1000.0000 mg | Freq: Once | INTRAVENOUS | Status: AC
  Administered 2016-12-29: 1000 mg via INTRAVENOUS
  Filled 2016-12-29: qty 200

## 2016-12-29 MED ORDER — INSULIN ASPART 100 UNIT/ML ~~LOC~~ SOLN
2.0000 [IU] | Freq: Once | SUBCUTANEOUS | Status: AC
  Administered 2016-12-30: 2 [IU] via SUBCUTANEOUS

## 2016-12-29 MED ORDER — INSULIN ASPART PROT & ASPART (70-30 MIX) 100 UNIT/ML ~~LOC~~ SUSP
15.0000 [IU] | Freq: Every day | SUBCUTANEOUS | Status: DC
  Administered 2016-12-29: 15 [IU] via SUBCUTANEOUS
  Filled 2016-12-29: qty 10

## 2016-12-29 MED ORDER — VANCOMYCIN HCL IN DEXTROSE 1-5 GM/200ML-% IV SOLN
1000.0000 mg | Freq: Three times a day (TID) | INTRAVENOUS | Status: DC
  Administered 2016-12-29 – 2016-12-30 (×3): 1000 mg via INTRAVENOUS
  Filled 2016-12-29 (×3): qty 200

## 2016-12-29 MED ORDER — SODIUM CHLORIDE 0.9 % IV SOLN
INTRAVENOUS | Status: AC
  Administered 2016-12-29 (×2): via INTRAVENOUS

## 2016-12-29 MED ORDER — INSULIN ASPART PROT & ASPART (70-30 MIX) 100 UNIT/ML ~~LOC~~ SUSP
20.0000 [IU] | Freq: Every day | SUBCUTANEOUS | Status: DC
  Administered 2016-12-30: 20 [IU] via SUBCUTANEOUS
  Filled 2016-12-29: qty 10

## 2016-12-29 MED ORDER — ACETAMINOPHEN 650 MG RE SUPP: 650.0000 mg | Freq: Four times a day (QID) | RECTAL | Status: DC | PRN

## 2016-12-29 MED ORDER — PIPERACILLIN-TAZOBACTAM 3.375 G IVPB 30 MIN
3.3750 g | Freq: Once | INTRAVENOUS | Status: AC
  Administered 2016-12-29: 3.375 g via INTRAVENOUS
  Filled 2016-12-29: qty 50

## 2016-12-29 NOTE — Progress Notes (Signed)
Pharmacy Antibiotic Note  Osie BondMohammed Lacroix is a 26 y.o. male admitted on 12/28/2016 with sepsis.  Pharmacy has been consulted for Vancomycin and Zosyn dosing.  Plan: Vancomycin 1gm IV every 8 hours.  Goal trough 15-20 mcg/mL. Zosyn 3.375g IV q8h (4 hour infusion).  Daily Serum Creatinine   Height: 5\' 7"  (170.2 cm) Weight: 200 lb 8 oz (90.9 kg) IBW/kg (Calculated) : 66.1  Temp (24hrs), Avg:98.9 F (37.2 C), Min:98 F (36.7 C), Max:100 F (37.8 C)   Recent Labs Lab 12/28/16 1836 12/28/16 2346 12/29/16 0452  WBC 13.0*  --  8.5  CREATININE 0.74  --  0.77  LATICACIDVEN  --  1.00  --     Estimated Creatinine Clearance: 153.1 mL/min (by C-G formula based on SCr of 0.77 mg/dL).    No Known Allergies  Antimicrobials this admission: Vancomycin 12/29/2016 >> Zosyn 12/29/2016 >>   Dose adjustments this admission: -  Microbiology results: pending  Thank you for allowing pharmacy to be a part of this patient's care.  Aleene DavidsonGrimsley Jr, Samiksha Pellicano Crowford 12/29/2016 6:51 AM

## 2016-12-29 NOTE — ED Notes (Signed)
ADMITTING PHYSICIAN AT THE BEDSIDE. 

## 2016-12-29 NOTE — H&P (Signed)
History and Physical    Kenneth James ZOX:[id number] DOB: 04-Feb-1991 DOA: 12/28/2016  PCP: System, Provider Not In  Patient coming from - home.  Arabic translator is used but patient wants to speak in [place].  Chief Complaint: Low blood sugar.   HPI: Kenneth James is a 26 y.o. male with history of diabetes mellitus type 1 on insulin who has had recent ASD closure month ago by Dr. Excell Seltzer on Plavix and aspirin presents to the ER with complaint that patient blood sugar has been low. Initially on presentation patient also had complained of some shortness of breath. Patient states that yesterday morning when he woke up his sugar was low because he didn't eat well. Reviewing his chart patient had called endocrinologist for neurology referral. Patient also had called his PCP for some rash on his leg.   ED Course: In the ER CT head was unremarkable. Blood sugar is elevated but patient is not in DKA. Patient was only mildly febrile with temperature maximum of 100 daily Fahrenheit. Patient has no signs of any meningismus. Patient states that when his sugar dropped history morning he had mild headache which has resolved. Denies any photophobia. Denies any insect bite or any recent travel away from [place]. Blood culture obtained was obtained urine drug screen was negative chest x-ray was unremarkable and patient is being admitted for further observation.  Review of Systems: As per HPI, rest all negative.   Past Medical History:  Diagnosis Date  . ASD (atrial septal defect)     Past Surgical History:  Procedure Laterality Date  . ATRIAL SEPTAL DEFECT(ASD) CLOSURE       reports that he has never smoked. He has never used smokeless tobacco. He reports that he does not drink alcohol or use drugs.  No Known Allergies  Family History  Problem Relation Age of Onset  . Diabetes Mellitus II Sister     Prior to Admission medications   Not on File    Physical Exam: Vitals:   12/28/16 2308 12/28/16 2336 12/29/16 0000 12/29/16 0030  BP:  90/65 (!) 109/57 125/74  Pulse:  (!) 120  (!) 114  Resp:  18 18 20   Temp: 100 F (37.8 C) 98.8 F (37.1 C)    TempSrc: Rectal     SpO2:  100% 100% 99%  Weight:  87.1 kg (192 lb)    Height:  5\' 8"  (1.727 m)        Constitutional: Moderately built and nourished. Vitals:   12/28/16 2308 12/28/16 2336 12/29/16 0000 12/29/16 0030  BP:  90/65 (!) 109/57 125/74  Pulse:  (!) 120  (!) 114  Resp:  18 18 20   Temp: 100 F (37.8 C) 98.8 F (37.1 C)    TempSrc: Rectal     SpO2:  100% 100% 99%  Weight:  87.1 kg (192 lb)    Height:  5\' 8"  (1.727 m)     Eyes: Anicteric no pallor. ENMT: No discharge from the ears eyes nose a multitude Neck: No neck rigidity no mass felt. No JVD appreciated. Respiratory: No rhonchi or crepitations. Cardiovascular: S1 and S2 heard no murmurs appreciated. Abdomen: Soft nontender bowel sounds present. Musculoskeletal: No edema. No joint effusion. Skin: No rash. Skin appears warm. Neurologic: Alert awake oriented to time place and person. Moves all extremities. Psychiatric: Appears normal. Denies any suicidal ideation.   Labs on Admission: I have personally reviewed following labs and imaging studies  CBC:  Recent Labs Lab 12/28/16 1836  WBC  13.0*  NEUTROABS 12.1*  HGB 14.3  HCT 40.5  MCV 84.6  PLT 311   Basic Metabolic Panel:  Recent Labs Lab 12/28/16 1836  NA 136  K 3.8  CL 101  CO2 25  GLUCOSE 213*  BUN 9  CREATININE 0.74  CALCIUM 9.2   GFR: Estimated Creatinine Clearance: 152.9 mL/min (by C-G formula based on SCr of 0.74 mg/dL). Liver Function Tests:  Recent Labs Lab 12/28/16 1836  AST 38  ALT 32  ALKPHOS 78  BILITOT 0.7  PROT 7.6  ALBUMIN 4.4   No results for input(s): LIPASE, AMYLASE in the last 168 hours. No results for input(s): AMMONIA in the last 168 hours. Coagulation Profile: No results for input(s): INR, PROTIME in the last 168 hours. Cardiac  Enzymes:  Recent Labs Lab 12/28/16 2334  CKTOTAL 189   BNP (last 3 results) No results for input(s): PROBNP in the last 8760 hours. HbA1C: No results for input(s): HGBA1C in the last 72 hours. CBG:  Recent Labs Lab 12/28/16 1848  GLUCAP 198*   Lipid Profile: No results for input(s): CHOL, HDL, LDLCALC, TRIG, CHOLHDL, LDLDIRECT in the last 72 hours. Thyroid Function Tests:  Recent Labs  12/28/16 2334  TSH 0.739   Anemia Panel: No results for input(s): VITAMINB12, FOLATE, FERRITIN, TIBC, IRON, RETICCTPCT in the last 72 hours. Urine analysis: No results found for: COLORURINE, APPEARANCEUR, LABSPEC, PHURINE, GLUCOSEU, HGBUR, BILIRUBINUR, KETONESUR, PROTEINUR, UROBILINOGEN, NITRITE, LEUKOCYTESUR Sepsis Labs: @LABRCNTIP (procalcitonin:4,lacticidven:4) )No results found for this or any previous visit (from the past 240 hour(s)).   Radiological Exams on Admission: Ct Head Wo Contrast  Result Date: 12/28/2016 CLINICAL DATA:  26 year old male with altered mental status. EXAM: CT HEAD WITHOUT CONTRAST TECHNIQUE: Contiguous axial images were obtained from the base of the skull through the vertex without intravenous contrast. COMPARISON:  None. FINDINGS: Brain: No evidence of acute infarction, hemorrhage, hydrocephalus, extra-axial collection or mass lesion/mass effect. Vascular: No hyperdense vessel or unexpected calcification. Skull: Normal. Negative for fracture or focal lesion. Sinuses/Orbits: No acute finding. Other: None. IMPRESSION: Unremarkable noncontrast head CT. Electronically Signed   By: Harmon Pier M.D.   On: 12/28/2016 20:13   Dg Chest Port 1 View  Result Date: 12/28/2016 CLINICAL DATA:  Shortness of breath. EXAM: PORTABLE CHEST 1 VIEW COMPARISON:  None. FINDINGS: Low lung volumes. Heart at the upper limits normal in size. Questionable septal closure device projecting over the right heart versus external artifact. Bronchovascular crowding secondary to low lung volumes. No  confluent airspace disease. No large pleural effusion. No pneumothorax. No osseous abnormalities are seen. IMPRESSION: Low lung volumes. Borderline heart size likely secondary to technique and low lung volumes. Electronically Signed   By: Rubye Oaks M.D.   On: 12/28/2016 18:51    EKG: Independently reviewed. Sinus tachycardia.  Assessment/Plan Principal Problem:   SIRS (systemic inflammatory response syndrome) (HCC) Active Problems:   Type 1 diabetes mellitus (HCC)   History of repair of congenital atrial septal defect (ASD)    1. SIRS - not sure of the source and likely cause. For now I have placed patient on fluids and empiric antibiotics including vancomycin and Zosyn and doxycycline. Follow blood cultures UA is pending and I have also ordered RMSF and Lyme titers. 2. Confusion/encephalopathy - patient appears mildly confused but is oriented to time place and person and has no neck rigidity or any signs of meningitis. If confusion persists may consider MRI brain. May need psychiatric consult. Check ammonia and TSH. 3. Diabetes mellitus type  1 - patient states his blood sugar was low yesterday morning. But he did take his insulin. For now will review his chart I have decreased his NovoLog 70/30 insulin to 20 units in the morning and 15 in the night with sliding scale coverage. Closely follow metabolic panel and CBGs. 4. History of recent ASD repair on Plavix and aspirin.   DVT prophylaxis: SCDs. Code Status: Full code.  Family Communication: Discussed with patient.  Disposition Plan: Home.  Consults called: None.  Admission status: Observation.    Eduard ClosKAKRAKANDY,ARSHAD N. MD [facility] Pager [phone].  If 7PM-7AM, please contact night-coverage [email or web address] Password [email or web address]  12/29/2016, 1:05 AM

## 2016-12-29 NOTE — Progress Notes (Signed)
Agree history and physical performed by Dr.Kakrakandyearly this a.m. In addition  Originally from [place] Lives in [place] for [place] to go back to [place] at the end of summer Uncle had ASD repair in 17s, now 26 years old and doing well   Type 1 diabetes Prior history of posterior second and ostium secundum ASD status post repair-Dr. Burt Knack 11/24/16  ampltzer closure device  Postprocedure echocardiogram unremarkable    Admitted 2/2 metabolic encephalopathy, hyperglycemia-? Sepsis unknown cause Tells me he was hypoglycemic most of the morning on 12/29/16 and then went to sleep without doing anything and was found in the state that he was ound on admit   Labs on admission revealed WBC 13 left shift sedimentation rate 3 EtOH, salicylate (- ) UDS (-) P CXR 1 VW = low lung volumes CT head = no acute anomaly EKG my reading = PR interval 0.12, leftward axis however patient below 35 years Rate related changes secondary to tachycardia-no ischemia   On admission started vancomycin/Zosyn  unlikely late complication of his ASD His ekg shows tachycardia, leftward axis-compared to , apart from rate related changes there is no change from prior EKG done in his other chart   consult cardiology if he has another fever-I suspect that with his ASD repair recently he might have a low-grade bacteremia although his ESR points against any endocarditis at this juncture For now continue broad-spectrum antibiotics and follow cultures   Rest as per admitting physician

## 2016-12-29 NOTE — ED Notes (Addendum)
WILL TRANSPORT PT TO 4W 1434-1. AAOX3. PT IN NO APPARENT DISTRESS OR PAIN. IVF INFUSING W/O PAIN OR SWELLING. THE OPPORTUNITY TO ASK QUESTIONS WAS PROVIDED.

## 2016-12-30 DIAGNOSIS — E162 Hypoglycemia, unspecified: Secondary | ICD-10-CM | POA: Diagnosis not present

## 2016-12-30 DIAGNOSIS — R6510 Systemic inflammatory response syndrome (SIRS) of non-infectious origin without acute organ dysfunction: Secondary | ICD-10-CM | POA: Diagnosis not present

## 2016-12-30 LAB — CREATININE, SERUM: CREATININE: 0.71 mg/dL (ref 0.61–1.24)

## 2016-12-30 LAB — HEMOGLOBIN A1C
Hgb A1c MFr Bld: 6.3 % — ABNORMAL HIGH (ref 4.8–5.6)
Mean Plasma Glucose: 134 mg/dL

## 2016-12-30 LAB — GLUCOSE, CAPILLARY
GLUCOSE-CAPILLARY: 235 mg/dL — AB (ref 65–99)
GLUCOSE-CAPILLARY: 258 mg/dL — AB (ref 65–99)

## 2016-12-30 MED ORDER — INSULIN ASPART PROT & ASPART (70-30 MIX) 100 UNIT/ML ~~LOC~~ SUSP: 20.0000 [IU] | Freq: Every day | SUBCUTANEOUS | 11 refills | Status: DC

## 2016-12-30 MED ORDER — CLOPIDOGREL BISULFATE 75 MG PO TABS: 75.0000 mg | ORAL_TABLET | Freq: Every day | ORAL | Status: DC

## 2016-12-30 MED ORDER — INSULIN ASPART PROT & ASPART (70-30 MIX) 100 UNIT/ML ~~LOC~~ SUSP: 15.0000 [IU] | Freq: Every day | SUBCUTANEOUS | 11 refills | Status: DC

## 2016-12-30 NOTE — Progress Notes (Signed)
Patient discharged with all belongings. Denies pain or discomfort. No signs or symptoms of distress noted. Pt refused discharge packet stating "i dont need". Pt stated that he was on same medications at home and is familiar with them. Pt also states he has prescribed medication at home. Reviewed discharge packet and pt verbalized understanding. Instructed pt to follow up with PCP within 1 week.

## 2016-12-31 ENCOUNTER — Encounter: Payer: Self-pay | Admitting: Physician Assistant

## 2016-12-31 LAB — B. BURGDORFI ANTIBODIES: B burgdorferi Ab IgG+IgM: <0.91 {ISR} (ref 0.00–0.90)

## 2017-01-01 ENCOUNTER — Encounter: Payer: Self-pay | Admitting: Cardiovascular Disease

## 2017-01-01 LAB — ROCKY MTN SPOTTED FVR ABS PNL(IGG+IGM)
RMSF IGM: 0.13 {index} (ref 0.00–0.89)
RMSF IgG: NEGATIVE

## 2017-01-01 NOTE — Telephone Encounter (Signed)
I attempted to contact pt by telephone to discuss symptoms--I received message that customer was unavailable, try again later.

## 2017-01-02 ENCOUNTER — Telehealth: Payer: Self-pay | Admitting: Endocrinology

## 2017-01-02 NOTE — Telephone Encounter (Signed)
When pt starts pump, please start with these settings: basal rate of 1.3 units/hr, except for units/hr, 24 hrs per day mealtime bolus of 1 unit/25 grams carbohydrate correction bolus (which some people call "sensitivity," or "insulin sensitivity ratio," or just "isr") of 1 unit for each 50 by which your glucose exceeds 100.

## 2017-01-03 ENCOUNTER — Encounter: Payer: Self-pay | Admitting: Cardiovascular Disease

## 2017-01-03 LAB — CULTURE, BLOOD (ROUTINE X 2)
CULTURE: NO GROWTH
Culture: NO GROWTH
SPECIAL REQUESTS: ADEQUATE
Special Requests: ADEQUATE

## 2017-01-04 LAB — T3, FREE: T3 FREE: 3.1 pg/mL

## 2017-01-07 ENCOUNTER — Other Ambulatory Visit: Payer: Self-pay | Admitting: Endocrinology

## 2017-01-07 ENCOUNTER — Encounter: Payer: PPO | Attending: Endocrinology | Admitting: Nutrition

## 2017-01-07 DIAGNOSIS — E109 Type 1 diabetes mellitus without complications: Secondary | ICD-10-CM

## 2017-01-07 DIAGNOSIS — E1065 Type 1 diabetes mellitus with hyperglycemia: Secondary | ICD-10-CM

## 2017-01-08 ENCOUNTER — Encounter: Payer: PPO | Admitting: Nutrition

## 2017-01-08 DIAGNOSIS — E109 Type 1 diabetes mellitus without complications: Secondary | ICD-10-CM

## 2017-01-08 NOTE — Progress Notes (Signed)
Pt. Reports no difficulty giving boluses, sleeping with the pump, or wearing it.   Pt. Reports many lows, and one high.   After downloading pump, he is taking a bolus without eating the carbs, as well as doing a correction dose to get the reading down.  We discussed why this was causing his blood sugars to drop low, and the one high (3500 was due to a drop of 45 1 hour before.  We discussed the liver, and how it excretes insulin when the blood sugar drops low. We discussed low blood sugars.  He was hospitalized last week, because he took insulin without eating.  Discussed what he needs to do when the blood sugar goes high, to see if he needs a correction dose, and he re demonstrated how to do this.  He reported good understanding of this.  We also discussed what to do when he over treats the lows.  He will add up the carbs he eats and subtract 20 grams from the total and give the bolus after eating.  He agreed to do this. We discussed temp basal rates--when, and how to do this, and how much to decrease/increase.  He reported good understanding of this. We also dicussed sick days, he was given handouts on emergency supplies to carry with him, as well as sick day guidelines, and low blood sugar protocols.  He had no final questions.  He has an appt. With the dietitian on Thursday.  He has downloaded the [employer] app., and says he is using this for help with carb counting.

## 2017-01-08 NOTE — Patient Instructions (Signed)
Read over handouts given a call if questions. Read over booklets 1 and 2 again, and read the Auto mode booklet before coming in next Wednesday.

## 2017-01-09 NOTE — Patient Instructions (Signed)
Test blood sugars before meals, 2 hours after meals,  and at bedtime. Call office if blood sugars drop low, or remain over 250.

## 2017-01-09 NOTE — Progress Notes (Addendum)
Pt. Was trained on the 670G insulin pump.  He reports having read the pump book, and CGM book.  He filled a reservour with Novolog insulin as directed and attached the tubing (mio 9mm) to his upper left abdomen without any hesitation and discomfort.  We reviewed how to give a bolus, and how to put the pump in stop.  He re demonstrated this X2 without assistance from me.  He reported good understanding of this. Settings: Per Dr. George HughEllison's order:  Basal rate: 1.3u/hr, I/C ratio: 25, ISF: 50, target: 100.   We linked his meter and Transmitter to his pump.  He was shown how to use the meter and reported good understanding of this.   He also wanted to insert the sensor, so he was trained on how to insert, tape and start the sensor.  Settings were set to Low alert: less than 80, high alert: 250 or greater.  We reviewed the need to calibrate the sensor q 8 hours and how to do this.  He re demonstrated this and had no questions. I will call him tonight to see how he is doing.  He was told call the office if Blood sugars drop low or remain high over 250.  He agreed to do this. Supply list of pump supplies put on Dr. George HughEllison's desk to order a 1 year supply at patient's request

## 2017-01-10 ENCOUNTER — Ambulatory Visit: Payer: PPO | Admitting: Dietician

## 2017-01-10 ENCOUNTER — Telehealth: Payer: Self-pay | Admitting: Dietician

## 2017-01-10 NOTE — Telephone Encounter (Signed)
Called patient due to patient not showing up for Carbohydrate counting review this am.  He has a new insulin pump.  There was no answer.  Oran ReinLaura Efren Kross, RD, LDN

## 2017-01-11 ENCOUNTER — Telehealth: Payer: Self-pay | Admitting: Endocrinology

## 2017-01-11 ENCOUNTER — Telehealth: Payer: Self-pay

## 2017-01-11 ENCOUNTER — Encounter: Payer: Self-pay | Admitting: Endocrinology

## 2017-01-11 NOTE — Telephone Encounter (Signed)
Patient called back and requested a year supply to be send in for his insulin to go in his insulin pump. I cannot see on the med list what medication to refill. Patient sent a my chart message as well on this. Patient is requesting a message to be sent via my chart from MD concerning this.

## 2017-01-11 NOTE — Telephone Encounter (Signed)
See message. The patient has submitted a my chart message on this and would like for you to respond via mychart. Thanks!

## 2017-01-11 NOTE — Telephone Encounter (Signed)
Routing to you °

## 2017-01-11 NOTE — Telephone Encounter (Signed)
Pharmacy called in reference to patient needing RX filled for new insulin pump and "Contour Next" test strips. Pharmacy also stated that [place] and will need a one year supply of both.    [id number]- [place], [place] - [address] [phone] (Phone) [phone] (Fax)

## 2017-01-11 NOTE — Discharge Summary (Signed)
Physician Discharge Summary  Osie BondMohammed Delagarza XBJ:[id number] DOB: 10/07/1990 DOA: 12/28/2016  PCP: System, Provider Not In  Admit date: 12/28/2016 Discharge date: 01/11/2017  Time spent: 25 minutes  Recommendations for Outpatient Follow-up:  1. Follow up with endocrinologist as an outpatient 2. Needs close monitoring of sugars  Discharge Diagnoses:  Principal Problem:   SIRS (systemic inflammatory response syndrome) (HCC) Active Problems:   Type 1 diabetes mellitus (HCC)   History of repair of congenital atrial septal defect (ASD)   Discharge Condition: Fair  Diet recommendation: Diabetic  Filed Weights   12/28/16 2336 12/29/16 0257 12/30/16 [id number]  Weight: 87.1 kg (192 lb) 90.9 kg (200 lb 8 oz) 89.3 kg (196 lb 13.9 oz)    History of present illness:  26 year old male known type 1 diabetes, history of ASD repair 11/24/16 Admitted with concerns for metabolic encephalopathy in the setting of poor appetite as well very low blood sugars On admission he had a left shift with WBC 13 ethanol salicylic acid levels were negative as well as urinary tract screening chest x-ray was normal CT head was normal EKG my read showed PR interval of 0.12 (axis however the patient was below 35 years therefore unable to interpret He had some rate related changes secondary to tachycardia and was completely alert and oriented by 12/29/16 and it was felt that he had a noninfectious metabolic encephalopathy abuse secondary to his hypoglycemia He was discharged in a stable condition to follow up with his endocrinologist as an outpatient   Discharge Exam: Vitals:   12/29/16 2236 12/30/16 0637  BP: 132/74 139/70  Pulse:  90  Resp:  17  Temp:  97.8 F (36.6 C)    Alert pleasant oriented no distress EOMI NCAT Chest clinically clear   Discharge Instructions   Discharge Instructions    Diet - low sodium heart healthy    Complete by:  As directed    Discharge instructions    Complete by:  As  directed    Continue insulin without fail--you must also eat when you take insulin--please follow with Dr. Everardo AllEllison as OP to adjust your meds I do not think that you have any infection and feel it is safe for you to go home   Discharge patient    Complete by:  As directed    Discharge disposition:  01-Home or Self Care   Discharge patient date:  12/30/2016   Increase activity slowly    Complete by:  As directed      Discharge Medication List as of 12/30/2016  1:15 PM    START taking these medications   Details  clopidogrel (PLAVIX) 75 MG tablet Take 1 tablet (75 mg total) by mouth daily., Starting Mon 12/31/2016, No Print    !! insulin aspart protamine- aspart (NOVOLOG MIX 70/30) (70-30) 100 UNIT/ML injection Inject 0.15 mLs (15 Units total) into the skin daily with supper., Starting Sun 12/30/2016, No Print    !! insulin aspart protamine- aspart (NOVOLOG MIX 70/30) (70-30) 100 UNIT/ML injection Inject 0.2 mLs (20 Units total) into the skin daily with breakfast., Starting Mon 12/31/2016, No Print     !! - Potential duplicate medications found. Please discuss with provider.     No Known Allergies    The results of significant diagnostics from this hospitalization (including imaging, microbiology, ancillary and laboratory) are listed below for reference.    Significant Diagnostic Studies: Ct Head Wo Contrast  Result Date: 12/28/2016 CLINICAL DATA:  26 year old male with altered mental status. EXAM: CT  HEAD WITHOUT CONTRAST TECHNIQUE: Contiguous axial images were obtained from the base of the skull through the vertex without intravenous contrast. COMPARISON:  None. FINDINGS: Brain: No evidence of acute infarction, hemorrhage, hydrocephalus, extra-axial collection or mass lesion/mass effect. Vascular: No hyperdense vessel or unexpected calcification. Skull: Normal. Negative for fracture or focal lesion. Sinuses/Orbits: No acute finding. Other: None. IMPRESSION: Unremarkable noncontrast head CT.  Electronically Signed   By: Harmon Pier M.D.   On: 12/28/2016 20:13   Dg Chest Port 1 View  Result Date: 12/28/2016 CLINICAL DATA:  Shortness of breath. EXAM: PORTABLE CHEST 1 VIEW COMPARISON:  None. FINDINGS: Low lung volumes. Heart at the upper limits normal in size. Questionable septal closure device projecting over the right heart versus external artifact. Bronchovascular crowding secondary to low lung volumes. No confluent airspace disease. No large pleural effusion. No pneumothorax. No osseous abnormalities are seen. IMPRESSION: Low lung volumes. Borderline heart size likely secondary to technique and low lung volumes. Electronically Signed   By: Rubye Oaks M.D.   On: 12/28/2016 18:51    Microbiology: No results found for this or any previous visit (from the past 240 hour(s)).   Labs: Basic Metabolic Panel: No results for input(s): NA, K, CL, CO2, GLUCOSE, BUN, CREATININE, CALCIUM, MG, PHOS in the last 168 hours. Liver Function Tests: No results for input(s): AST, ALT, ALKPHOS, BILITOT, PROT, ALBUMIN in the last 168 hours. No results for input(s): LIPASE, AMYLASE in the last 168 hours. No results for input(s): AMMONIA in the last 168 hours. CBC: No results for input(s): WBC, NEUTROABS, HGB, HCT, MCV, PLT in the last 168 hours. Cardiac Enzymes: No results for input(s): CKTOTAL, CKMB, CKMBINDEX, TROPONINI in the last 168 hours. BNP: BNP (last 3 results) No results for input(s): BNP in the last 8760 hours.  ProBNP (last 3 results) No results for input(s): PROBNP in the last 8760 hours.  CBG: No results for input(s): GLUCAP in the last 168 hours.     SignedRhetta Mura MD   [facility] 01/11/2017, 4:14 PM

## 2017-01-11 NOTE — Telephone Encounter (Signed)
Patient called to check the status of the note below. I advised that the nurse is waiting to hear back from Dr. Everardo AllEllison on approval. Please advise once approval or denial is received from provider.

## 2017-01-11 NOTE — Telephone Encounter (Signed)
Please advise on patient email.

## 2017-01-13 NOTE — Telephone Encounter (Signed)
L  Pt says he has obtained a new pump.  I am happy to do the refills, but is he working with you on starting the new pump?  S

## 2017-01-16 ENCOUNTER — Ambulatory Visit (INDEPENDENT_AMBULATORY_CARE_PROVIDER_SITE_OTHER): Payer: PPO | Admitting: Endocrinology

## 2017-01-16 ENCOUNTER — Encounter: Payer: Self-pay | Admitting: Endocrinology

## 2017-01-16 ENCOUNTER — Other Ambulatory Visit: Payer: Self-pay

## 2017-01-16 ENCOUNTER — Ambulatory Visit: Payer: PPO | Admitting: Nutrition

## 2017-01-16 VITALS — BP 132/84 | HR 84 | Ht 67.0 in | Wt 196.0 lb

## 2017-01-16 DIAGNOSIS — E109 Type 1 diabetes mellitus without complications: Secondary | ICD-10-CM

## 2017-01-16 MED ORDER — MINIMED PUMP RESERVOIR 3ML MISC: 1.0000 | 0 refills | Status: AC

## 2017-01-16 MED ORDER — INSULIN LISPRO 100 UNIT/ML ~~LOC~~ SOLN: SUBCUTANEOUS | 1 refills | Status: DC

## 2017-01-16 MED ORDER — "MIO INFUSION SET 32"" 9MM MISC": 1.0000 | 0 refills | Status: AC

## 2017-01-16 MED ORDER — INSULIN LISPRO 100 UNIT/ML ~~LOC~~ SOLN
SUBCUTANEOUS | 4 refills | Status: DC
Start: 2017-01-16 — End: 2017-01-16

## 2017-01-16 MED ORDER — GLUCAGON (RDNA) 1 MG IJ KIT: 1.0000 mg | PACK | Freq: Once | INTRAMUSCULAR | 12 refills | Status: AC | PRN

## 2017-01-16 MED ORDER — GUARDIAN TRANSMITTER MISC: 1.0000 | 0 refills | Status: AC

## 2017-01-16 MED ORDER — MINIMED GUARDIAN SENSOR 3 MISC: 1.0000 | 0 refills | Status: AC

## 2017-01-16 NOTE — Telephone Encounter (Signed)
Patient has a blood sugar of 300 and has not had his medication refilled. The original note was put in 7/20. Patient stated that he was going to the ER if he did not receive a call back before call ended. Notified Stephanie.

## 2017-01-16 NOTE — Patient Instructions (Addendum)
  check your blood sugar 4 times a day.  vary the time of day when you check, between before the 3 meals, and at bedtime.  also check if you have symptoms of your blood sugar being too high or too low.  please keep a record of the readings and bring it to your next appointment here (or you can bring the meter itself).  You can write it on any piece of paper.  please call us sooner if your blood sugar goes below 70, or if you have a lot of readings over 200.  blood tests are requested for you today.  We'll let you know about the results.   I have sent a prescription to your pharmacy, for a medication called "glucagon."  This is a single-use emergency kit.  It will help you when your blood sugar is so low, you need someone else to help you.  The side-effect is nausea, so you should be turned on your side after getting this shot.  Please take these settings: basal rate of 1.2 units/hr, 24 hrs per day.  mealtime bolus of 1 unit/20 grams carbohydrate correction bolus (which some people call "sensitivity," or "insulin sensitivity ratio," or just "isr") of 1 unit for each 100 by which your glucose exceeds 100.  If you exercise, suspend ir take the pump off x 1-2 hours.  Please call or message [place] next week, to tell us how the blood sugar is doing.   Best wishes on your upcoming move.

## 2017-01-16 NOTE — Telephone Encounter (Signed)
Pharmacy called to get clarification on the medication that is needed for the patient's pump. Patient has advised the pharmacy that his sugar is above 300. Please advise.

## 2017-01-16 NOTE — Telephone Encounter (Signed)
Pt has started to use the insulin pump but now needs fast acting insulin to go in it please his BS is over 300  Pt needs [facility] to [facility]

## 2017-01-16 NOTE — Progress Notes (Signed)
Subjective:    Patient ID: Kenneth James, male    DOB: Nov 13, 1990, 26 y.o.   MRN: [id number]  HPI Pt returns for f/u of diabetes mellitus: DM type: 1 Dx'ed: 5681 Complications: BDR Therapy: insulin since dx.  DKA: only at dx.  Severe hypoglycemia: several episodes (most recently in early 2018--none since on pump).   Pancreatitis: never.  Other: he changed to bid premixed insulin, after poor results with multiple daily injections; he declines pump rx; he is a [employer].   Interval history: He has mild hypoglycemia approx once per week.  This happens in the afternoon, when he is active.  no cbg record, but states cbg's are usually higher fasting than later in the day.  He will move back to [place] soon.   He takes these settings: Basal rate: 1.3u/hr, I/C ratio: 25, ISF: 50, target: 100.   Since on the pump, he has mild hypoglycemia approx once per day.  This happens after exercise or after correction bolus.  no cbg record, but states cbg's are highest after a meal.  He says cbg's vary from 50-300.   Past Medical History:  Diagnosis Date  . ASD (atrial septal defect)   . ASD (atrial septal defect), ostium secundum    s/p closer 11/23/16  . Diabetes mellitus without complication [facility])     Past Surgical History:  Procedure Laterality Date  . ATRIAL SEPTAL DEFECT(ASD) CLOSURE N/A 11/23/2016   Procedure: Atrial Septal Defect(ASD) Closure;  Surgeon: Sherren Mocha, MD;  Location: [place] CV LAB;  Service: Cardiovascular;  Laterality: N/A;  . ATRIAL SEPTAL DEFECT(ASD) CLOSURE    . TEE WITHOUT CARDIOVERSION N/A 11/06/2016   Procedure: TRANSESOPHAGEAL ECHOCARDIOGRAM (TEE);  Surgeon: Lelon Perla, MD;  Location: [facility] ENDOS[facility];  Service: Cardiovascular;  Laterality: N/A;    Social History   Social History  . Marital status: Single    Spouse name: N/A  . Number of children: N/A  . Years of education: N/A   Occupational History  . Not on file.   Social History  Main Topics  . Smoking status: Never Smoker  . Smokeless tobacco: Never Used  . Alcohol use No  . Drug use: No  . Sexual activity: Not on file   Other Topics Concern  . Not on file   Social History Narrative   ** Merged History Encounter **        Current Outpatient Prescriptions on File Prior to Visit  Medication Sig Dispense Refill  . aspirin EC 81 MG tablet Take 1 tablet (81 mg total) by mouth daily.    . Cholecalciferol (VITAMIN D3) 5000 units CAPS Take 5,000 Units by mouth once a week.    . clopidogrel (PLAVIX) 75 MG tablet Take 1 tablet (75 mg total) by mouth daily. 30 tablet 6   No current facility-administered medications on file prior to visit.     No Known Allergies  Family History  Problem Relation Age of Onset  . Diabetes Mellitus II Sister   . Diabetes Other   . Diabetes Sister   . Diabetes Paternal Grandfather     BP 132/84   Pulse 84   Ht _0  (1.702 m)   Wt 196 lb (88.9 kg)   SpO2 98%   BMI 30.70 kg/m   Review of Systems Denies LOC    Objective:   Physical Exam VITAL SIGNS:  See vs page.  GENERAL: no distress.  Pulses: foot pulses are intact bilaterally.   MSK: no  deformity of the feet or ankles.  CV: no edema of the legs or ankles.  Skin:  no ulcer on the feet or ankles.  normal color and temp on the feet and ankles.   Neuro: sensation is intact to touch on the feet and ankles.       Assessment & Plan:  Type 1 DM: overcontrolled on pump rx  Patient Instructions   check your blood sugar 4 times a day.  vary the time of day when you check, between before the 3 meals, and at bedtime.  also check if you have symptoms of your blood sugar being too high or too low.  please keep a record of the readings and bring it to your next appointment here (or you can bring the meter itself).  You can write it on any piece of paper.  please call us sooner if your blood sugar goes below 70, or if you have a lot of readings over 200.  blood tests are  requested for you today.  We'll let you know about the results.   I have sent a prescription to your pharmacy, for a medication called "glucagon."  This is a single-use emergency kit.  It will help you when your blood sugar is so low, you need someone else to help you.  The side-effect is nausea, so you should be turned on your side after getting this shot.  Please take these settings: basal rate of 1.2 units/hr, 24 hrs per day.  mealtime bolus of 1 unit/20 grams carbohydrate correction bolus (which some people call "sensitivity," or "insulin sensitivity ratio," or just "isr") of 1 unit for each 100 by which your glucose exceeds 100.  If you exercise, suspend ir take the pump off x 1-2 hours.  Please call or message [place] next week, to tell us how the blood sugar is doing.   Best wishes on your upcoming move.

## 2017-01-16 NOTE — Telephone Encounter (Signed)
[employer] told patient they cant provide his pump supplies. He thinks he needs an approval from insurance.  Please advise.  -LL

## 2017-01-17 ENCOUNTER — Ambulatory Visit: Payer: PPO | Admitting: Endocrinology

## 2017-01-18 ENCOUNTER — Other Ambulatory Visit: Payer: Self-pay

## 2017-01-18 MED ORDER — GLUCOSE BLOOD VI STRP: ORAL_STRIP | 5 refills | Status: AC

## 2017-01-18 NOTE — Progress Notes (Signed)
Kenneth James was instructed on the automode portion of his pump via the slide presentation.  He put his pump in auto mode, without difficulty, and signed off as understanding all topics without any final questions. I stressed the need to test blood sugars before meal, and put all carbs eaten into the pump, calibrate the sensor q 8 hours, and to read and do what the pump tells him to do.  He reported good understanding of this.  He is leaving the country the end of the month, and will discuss the need for his pump supply prescription with Dr. Everardo AllEllison.  He had no final questions for me.

## 2017-01-18 NOTE — Patient Instructions (Addendum)
Test blood sugars before all meals,  Put in the carbs of all meals and snacks eaten, Do calibrations of the sensor every 8 hours When an alert occurs, do what the pump tells you to do.

## 2017-01-21 NOTE — Telephone Encounter (Signed)
Patient reports that he has run out of insulin, and that as soon as he gets the insulin, he will fill the reservoir and re start his pump.  Waiting for pharmacist to fill script for insulin.  Told that he can come and pick up a vial, but said the pharmacist is filling it now.  He was told to call me in 2 hours to make sure the blood sugar has come down.

## 2017-01-21 NOTE — Telephone Encounter (Signed)
Pt. Reports that blood sugar has come down, and that it is now 179.  Denied any  Nausea when blood sugar was high.

## 2017-01-23 ENCOUNTER — Encounter: Payer: PPO | Attending: Endocrinology | Admitting: Nutrition

## 2017-01-23 DIAGNOSIS — E1065 Type 1 diabetes mellitus with hyperglycemia: Secondary | ICD-10-CM

## 2017-01-25 ENCOUNTER — Telehealth: Payer: Self-pay | Admitting: Cardiovascular Disease

## 2017-01-25 DIAGNOSIS — R079 Chest pain, unspecified: Secondary | ICD-10-CM

## 2017-01-25 DIAGNOSIS — Q211 Atrial septal defect, unspecified: Secondary | ICD-10-CM

## 2017-01-25 NOTE — Telephone Encounter (Signed)
Attempted to reach pt but no answer.  I have scheduled the pt for limited echocardiogram and office visit next week.  I will send these appointments through My Chart.

## 2017-01-25 NOTE — Telephone Encounter (Signed)
New message    Pt is calling about scheduling an echo. There is no order for an echo in Epic.

## 2017-01-28 NOTE — Telephone Encounter (Signed)
Reviewed chart and the pt reviewed his my chart message in regards to appointments on -01/25/2017 8:10 PM.

## 2017-01-29 ENCOUNTER — Other Ambulatory Visit: Payer: Self-pay

## 2017-01-29 ENCOUNTER — Ambulatory Visit (HOSPITAL_COMMUNITY): Payer: PPO | Attending: Cardiology

## 2017-01-29 DIAGNOSIS — R079 Chest pain, unspecified: Secondary | ICD-10-CM | POA: Diagnosis not present

## 2017-01-29 DIAGNOSIS — Q211 Atrial septal defect, unspecified: Secondary | ICD-10-CM

## 2017-01-29 DIAGNOSIS — I517 Cardiomegaly: Secondary | ICD-10-CM | POA: Diagnosis not present

## 2017-01-30 ENCOUNTER — Ambulatory Visit (INDEPENDENT_AMBULATORY_CARE_PROVIDER_SITE_OTHER): Payer: PPO | Admitting: Cardiovascular Disease

## 2017-01-30 ENCOUNTER — Encounter: Payer: Self-pay | Admitting: Cardiovascular Disease

## 2017-01-30 VITALS — BP 132/60 | HR 72 | Ht 64.0 in | Wt 200.8 lb

## 2017-01-30 DIAGNOSIS — Q211 Atrial septal defect, unspecified: Secondary | ICD-10-CM

## 2017-01-30 MED ORDER — AMOXICILLIN 500 MG PO CAPS: ORAL_CAPSULE | ORAL | 0 refills | Status: AC

## 2017-01-30 NOTE — Patient Instructions (Addendum)
Medication Instructions:  STOP Plavix (Clopidogrel) at the end of August CONTINUE Aspirin for 1 year from surgery TAKE Amoxicillin 2000 mg (2 G) 1 hour before dental work - you will only need to do this for the next 4 months   Labwork: None Ordered   Testing/Procedures: None Ordered   Follow-Up: Your physician recommends that you schedule a follow-up appointment in: as needed with Dr. Excell Seltzerooper   If you need a refill on your cardiac medications before your next appointment, please call your pharmacy.   Thank you for choosing [facility]! Eligha BridegroomMichelle Swinyer, RN [phone]

## 2017-01-30 NOTE — Progress Notes (Signed)
Cardiology Office Note Date:  01/30/2017   ID:  Kenneth James, DOB 1991-03-06, MRN [id number]  PCP:  System, Provider Not In  Cardiologist:  Tonny Bollmanooper, Dilyn Osoria, MD    Chief Complaint  Patient presents with  . Follow-up    echo     History of Present Illness: Kenneth James is a 26 y.o. male who presents for follow-up After undergoing transcatheter ASD closure 11/23/2016 with a 24 mm Amplatzer septal occluder device. The patient's procedure was uncomplicated but his AST was somewhat difficult to close and multiple devices were attempted. He was discharged home the following morning.  The patient initially presented with atypical chest pain. An echocardiogram demonstrated a dilated right atrium and right ventricle. The patient was diagnosed with an ASD and a TEE was performed. Because of his dilated right heart with a large ASD he was referred for transcatheter closure.  The patient is here alone today. He recently had an echocardiogram after calling in with chest discomfort. This showed normal position of his device and no evidence of paracardial effusion or other abnormalities. He's had a few fleeting veins in the chest but no recent pressure-like sensation. He denies orthopnea, PND, or shortness of breath.   Past Medical History:  Diagnosis Date  . ASD (atrial septal defect)   . ASD (atrial septal defect), ostium secundum    s/p closer 11/23/16  . Diabetes mellitus without complication [facility](HCC)     Past Surgical History:  Procedure Laterality Date  . ATRIAL SEPTAL DEFECT(ASD) CLOSURE N/A 11/23/2016   Procedure: Atrial Septal Defect(ASD) Closure;  Surgeon: Tonny Bollmanooper, Skila Rollins, MD;  Location: [facility];  Service: Cardiovascular;  Laterality: N/A;  . ATRIAL SEPTAL DEFECT(ASD) CLOSURE    . TEE WITHOUT CARDIOVERSION N/A 11/06/2016   Procedure: TRANSESOPHAGEAL ECHOCARDIOGRAM (TEE);  Surgeon: Lewayne Buntingrenshaw, Brian S, MD;  [facility];  Service: Cardiovascular;  Laterality: N/A;      Current Outpatient Prescriptions  Medication Sig Dispense Refill  . aspirin EC 81 MG tablet Take 1 tablet (81 mg total) by mouth daily.    . Continuous Blood Gluc Transmit (GUARDIAN TRANSMITTER) MISC 1 Device by Does not apply route every 6 (six) months. Pt needs 1 year supply, due to being in a [place] x 1 year 2 each 0  . glucagon 1 MG injection Inject 1 mg into the skin once as needed. 3 each 12  . glucose blood (CONTOUR NEXT TEST) test strip Use as instructed to check sugar 6 times daily 600 each 5  . Insulin Infusion Pump Supplies (MINIMED PUMP RESERVOIR 3ML) MISC 1 Device by Does not apply route every 3 (three) days. Needs 1 year supplies, as will live in developing country x 1 year. 120 each 0  . Insulin Infusion Pump Supplies (MIO INFUSION SET 32" 9MM) MISC 1 Device by Does not apply route every 3 (three) days. Pt needs 1 year supply, due to being in a developing country x 1 year 120 each 0  . insulin lispro (HUMALOG) 100 UNIT/ML injection Inject 63 Units into the skin continuous. Used with insulin pump    . amoxicillin (AMOXIL) 500 MG capsule Take 4 capsules (2000 mg) 1 hour before dental work 4 capsule 0   No current facility-administered medications for this visit.     Allergies:   Patient has no known allergies.   Social History:  The patient  reports that he has never smoked. He has never used smokeless tobacco. He reports that he does not drink alcohol  or use drugs.   Family History:  The patient's family history includes Diabetes in his other, paternal grandfather, and sister; Diabetes Mellitus II in his sister.    ROS:  Please see the history of present illness.  All other systems are reviewed and negative.    PHYSICAL EXAM: VS:  BP 132/60   Pulse 72   Ht 5\' 4"  (1.626 m)   Wt 200 lb 12.8 oz (91.1 kg)   BMI 34.47 kg/m  , BMI Body mass index is 34.47 kg/m. GEN: Well nourished, well developed, in no acute distress  HEENT: normal  Neck: no JVD, no  masses. No carotid bruits Cardiac: RRR without murmur or gallop                Respiratory:  clear to auscultation bilaterally, normal work of breathing GI: soft, nontender, nondistended, + BS MS: no deformity or atrophy  Ext: no pretibial edema, pedal pulses 2+= bilaterally Skin: warm and dry, no rash Neuro:  Strength and sensation are intact Psych: euthymic mood, full affect  EKG:  EKG is not ordered today.  Recent Labs: 11/24/2016: Magnesium 1.9 12/28/2016: TSH 0.739 12/29/2016: ALT 21; BUN 9; Hemoglobin 12.7; Platelets 259; Potassium 4.2; Sodium 133 12/30/2016: Creatinine, Ser 0.71   Lipid Panel     Component Value Date/Time   CHOL 114 08/17/2016 1455   TRIG 43.0 08/17/2016 1455   HDL 44.40 08/17/2016 1455   CHOLHDL 3 08/17/2016 1455   VLDL 8.6 08/17/2016 1455   LDLCALC 61 08/17/2016 1455      Wt Readings from Last 3 Encounters:  01/30/17 200 lb 12.8 oz (91.1 kg)  01/16/17 196 lb (88.9 kg)  12/30/16 196 lb 13.9 oz (89.3 kg)     Cardiac Studies Reviewed: 2D Echo 01-29-2017: Study Conclusions  - Left ventricle: The cavity size was normal. Systolic function was   normal. The estimated ejection fraction was in the range of 60%   to 65%. Wall motion was normal; there were no regional wall   motion abnormalities. - Mitral valve: There was trivial regurgitation. - Left atrium: The atrium was mildly dilated. - Right ventricle: The cavity size was mildly dilated. Wall   thickness was normal. - Atrial septum: Stable amplatzer ASD closure device with no   evidence of shunt by colorflow doppler.  ASSESSMENT AND PLAN: Ostium secundum ASD status post transcatheter closure with a 24 mm Amplatzer atrial septal occluder device. The patient is stable. I personally reviewed his echo images which demonstrate normal device position, no pericardial effusion, and normal LV function. I think the patient is doing well. I reassured him about some of his symptoms. He has a good bit of anxiety and  we spent a lot of time discussing expectations today. I think he should do very well now that his ASD is closed. He is going to return to [place] next week permanent way after finishing college at [employer]. I recommended that he continue clopidogrel the end of this month which will take him out to 3 months from device implantation. I advised him to continue on low-dose aspirin for a period of 12 months. He understands he needs to follow SBE prophylaxis for a total of 6 months. I recommended that he have a one-year follow-up echo next June.  Current medicines are reviewed with the patient today.  The patient does not have concerns regarding medicines.  Labs/ tests ordered today include:  No orders of the defined types were placed in this encounter.  Disposition:   FU prn  Signed, Tonny Bollman, MD  01/30/2017 5:25 PM    [facility] [address] [place], [place], [place]  [address] Phone: [phone]; Fax: [phone]

## 2017-01-30 NOTE — Progress Notes (Signed)
Reviewed all topics of insulin pump training.  He signed off the check list on pump, CGM, and automode. We reviewed topics on high blood sugar protocols, sick day guidelines, and temp basal rates--when and how to use these.  He was given handouts for all of these to keep with him.  He had no final questions.   Pt. Had some difficulty with new Mio infusions sets.  He brought in the one he had on last night, and the canula was not bent, but his blood sugars would not come down, despite multiple boluses.   LouAnn notified, and she is meeting him tomorrow in the office to try new sets (Quik Sets), saying some times she sees patients can not use these sets.  Will await her visit, to see if we need to exchange these sets. He reports no difficulty with other pump functions, neither the sensors, nor the auto mode.  Denies frequent sensor alerts, except the "high blood sugar alerts"- which he is very afraid of.  Tried to reassure him that these few high blood sugars will not affect his long term changes of complications. He had no final questions.

## 2017-02-04 NOTE — Patient Instructions (Signed)
See Kenneth James tomorrow for new infusion set trial.   Call if questions or problems.

## 2017-11-04 ENCOUNTER — Telehealth: Payer: Self-pay

## 2017-11-04 ENCOUNTER — Other Ambulatory Visit: Payer: Self-pay

## 2017-11-04 NOTE — Telephone Encounter (Signed)
I called patient back & was sent to VM. I left a detailed message asking about his previous prescription because it should not yet be expired. Also I needed to know if he wanted [place] to print it & him pick it up or sent it over electronically.

## 2017-11-04 NOTE — Telephone Encounter (Signed)
I called patient, but phone only rang with no VM to leave message. I need to know where to send prescription for guardian sensors.

## 2018-01-28 IMAGING — CT CT HEAD W/O CM
3 of 4 series · 15 of 47 positions shown, 18 images · non-contrast
Comparison: None.

CLINICAL DATA: 25-year-old male with altered mental status.

EXAM:
CT HEAD WITHOUT CONTRAST
TECHNIQUE: Contiguous axial images were obtained from the base of the skull
through the vertex without intravenous contrast.

[Series 2: head w/o · axial · non-contrast · 0.45mm/px · z∈[+1087,+1207]mm · 9 of 30 slices shown, 12 images]
[im 3/30  brain]
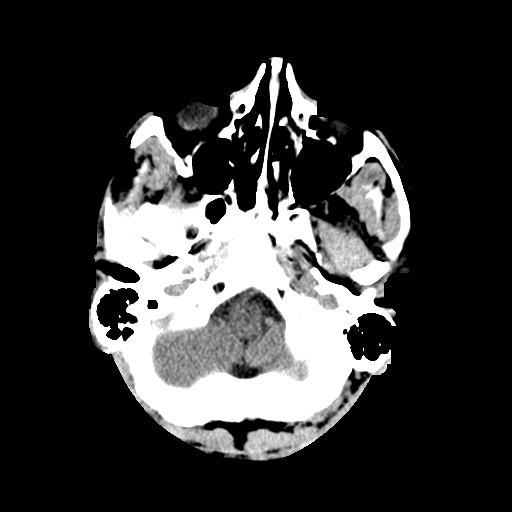
[im 3/30  bone]
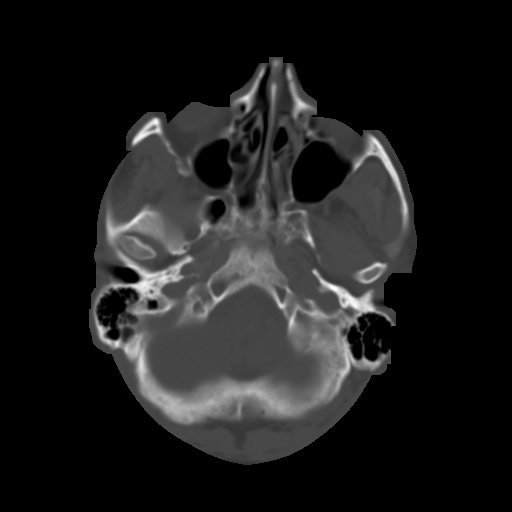
[im 7/30  brain]
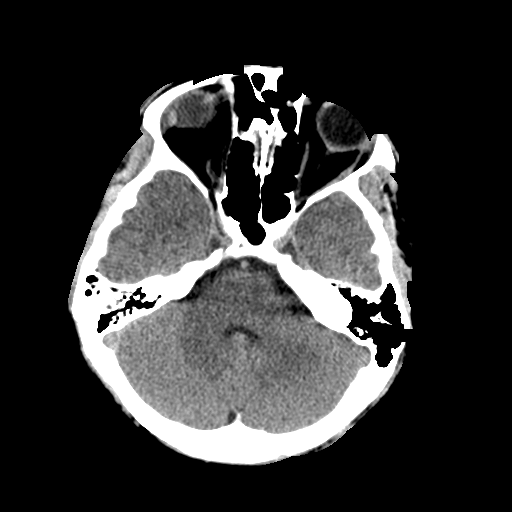
[im 9/30  brain]
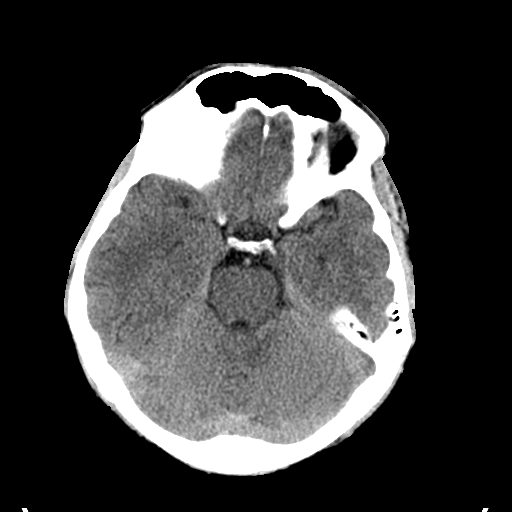
[im 13/30  brain]
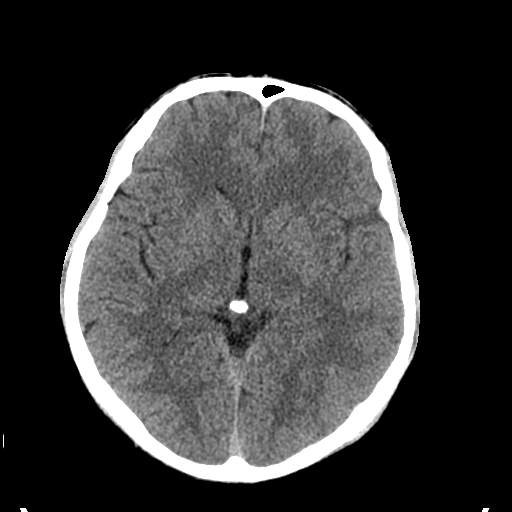
[im 15/30  brain]
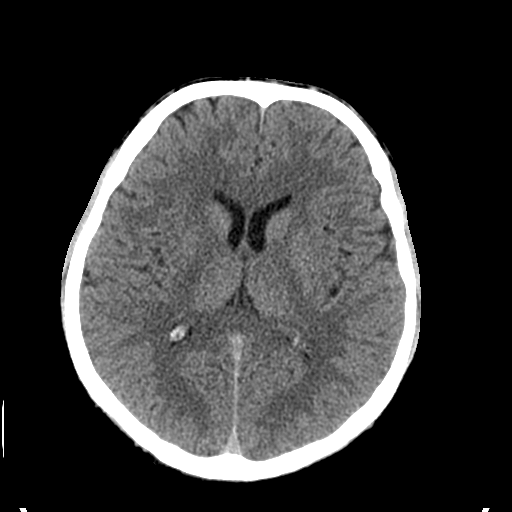
[im 15/30  bone]
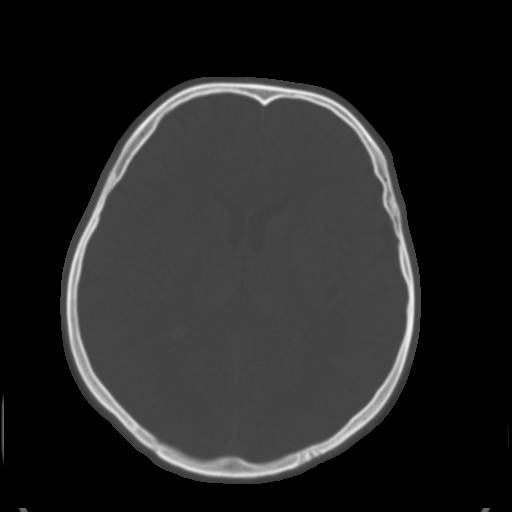
[im 17/30  brain]
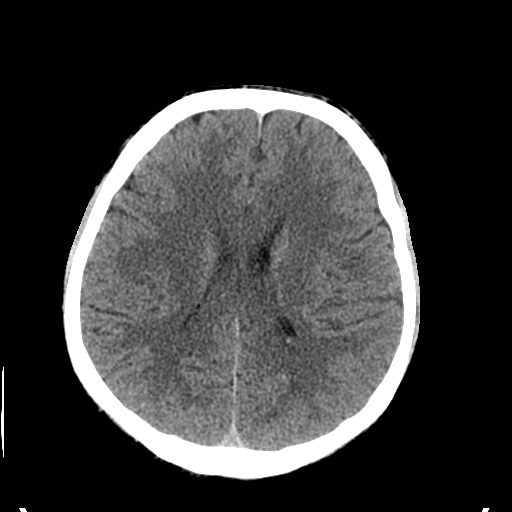
[im 21/30  brain]
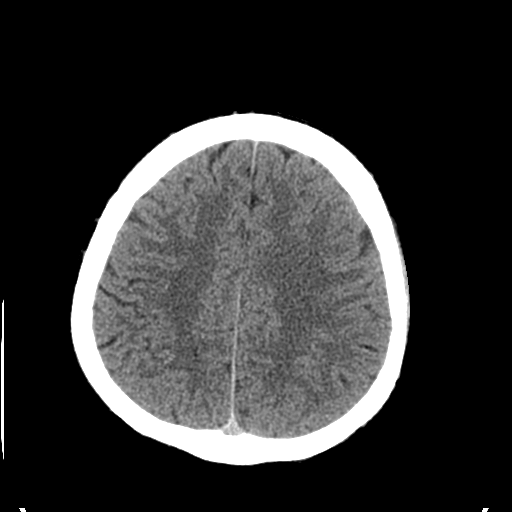
[im 23/30  brain]
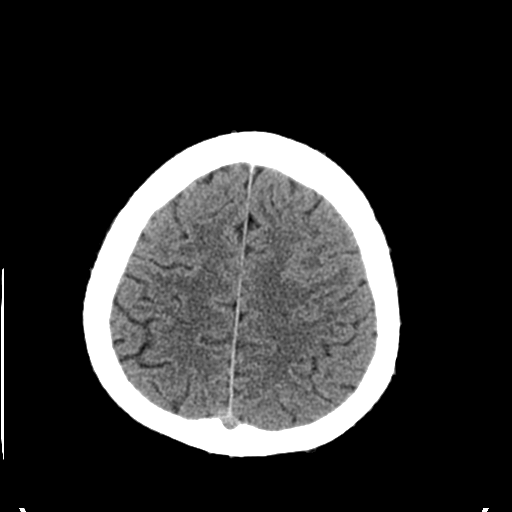
[im 27/30  brain]
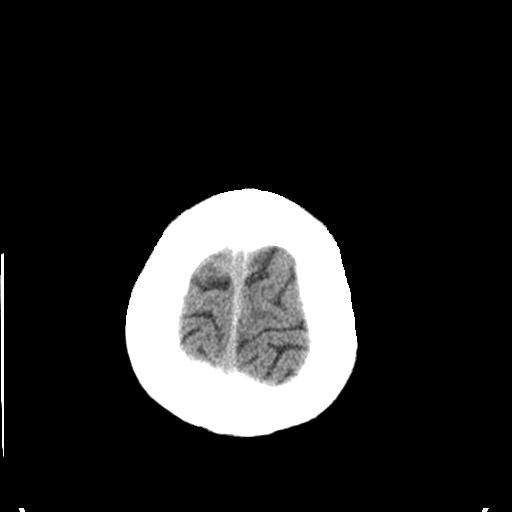
[im 27/30  bone]
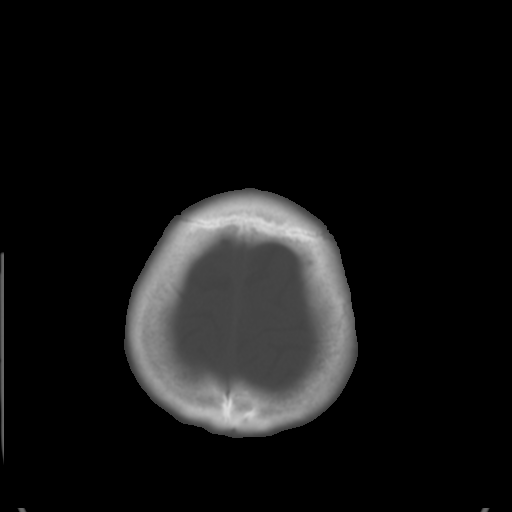

[Series 4: coronal · coronal · 0.29mm/px · 3 of 65 slices shown]
[im 22/65  brain]
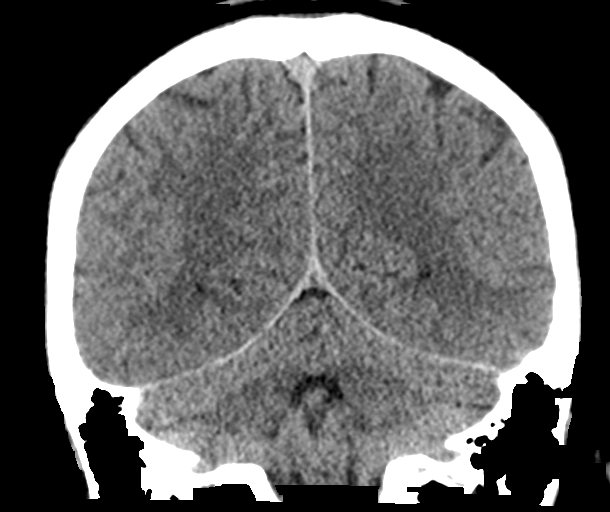
[im 29/65  brain]
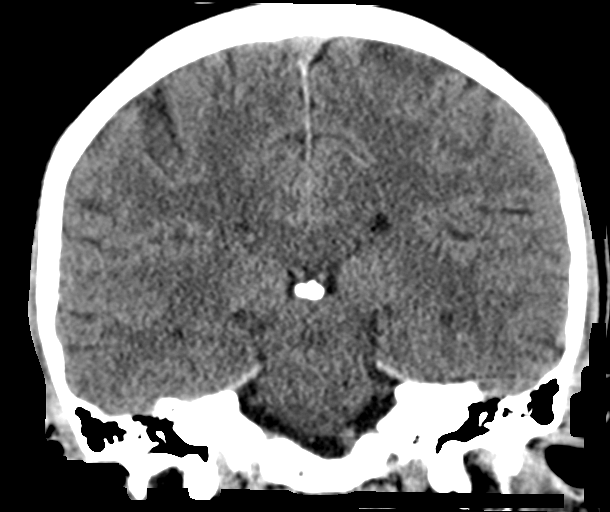
[im 36/65  brain]
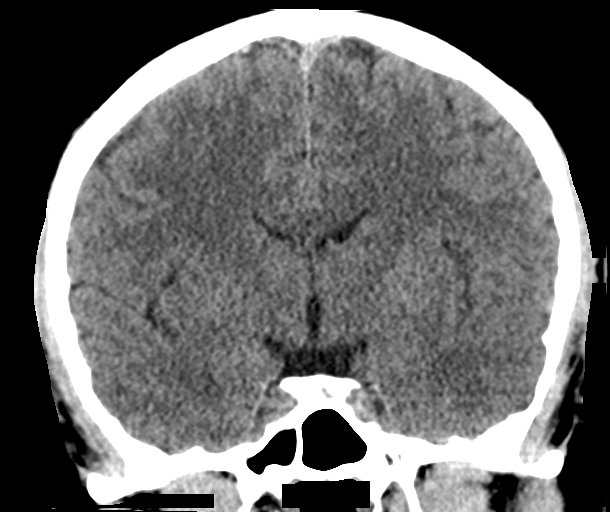

[Series 5: sagittal · sagittal · 0.29mm/px · 3 of 59 slices shown]
[im 20/59  brain]
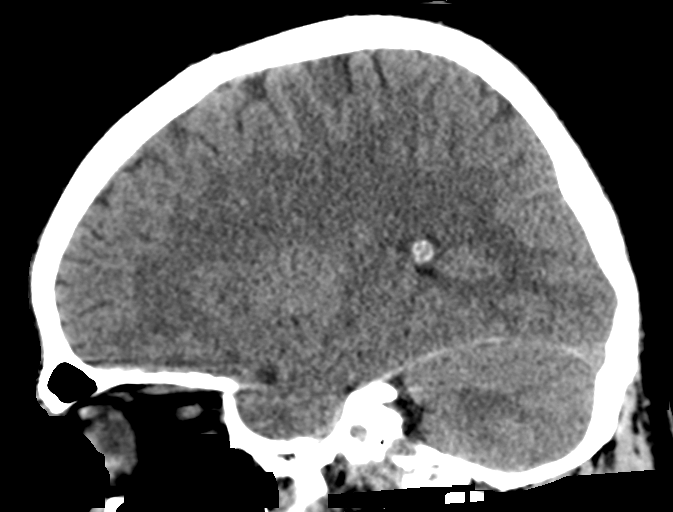
[im 30/59  brain]
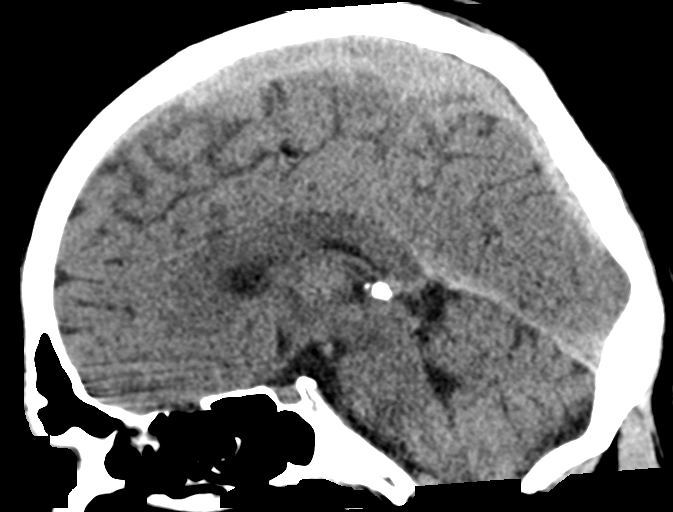
[im 39/59  brain]
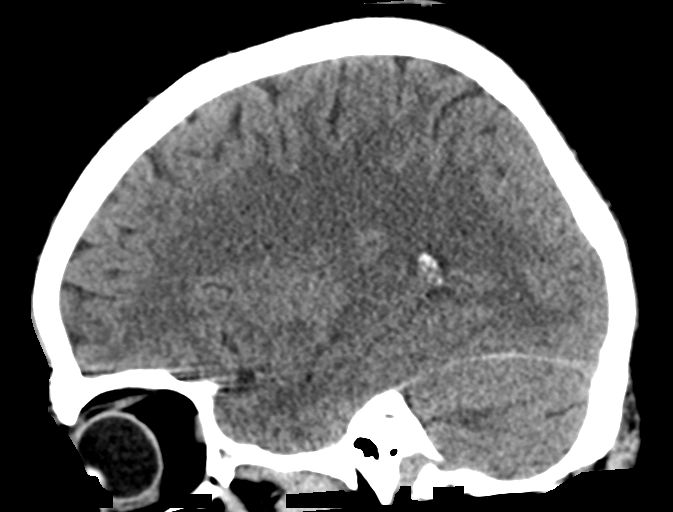

[15 of 47 positions shown; findings below may reference images not displayed]

FINDINGS: Brain: No evidence of acute infarction, hemorrhage, hydrocephalus,
extra-axial collection or mass lesion/mass effect.

Vascular: No hyperdense vessel or unexpected calcification.

Skull: Normal. Negative for fracture or focal lesion.

Sinuses/Orbits: No acute finding.

Other: None.
IMPRESSION: Unremarkable noncontrast head CT.

## 2019-01-07 IMAGING — DX DG CHEST 2V
2 series · 2 of 2 positions shown · non-contrast
Comparison: 03/03/2014 and 03/23/2013

CLINICAL DATA: Chest pain 2 days worse with deep inspiration.

EXAM:
CHEST  2 VIEW

[chest pa]
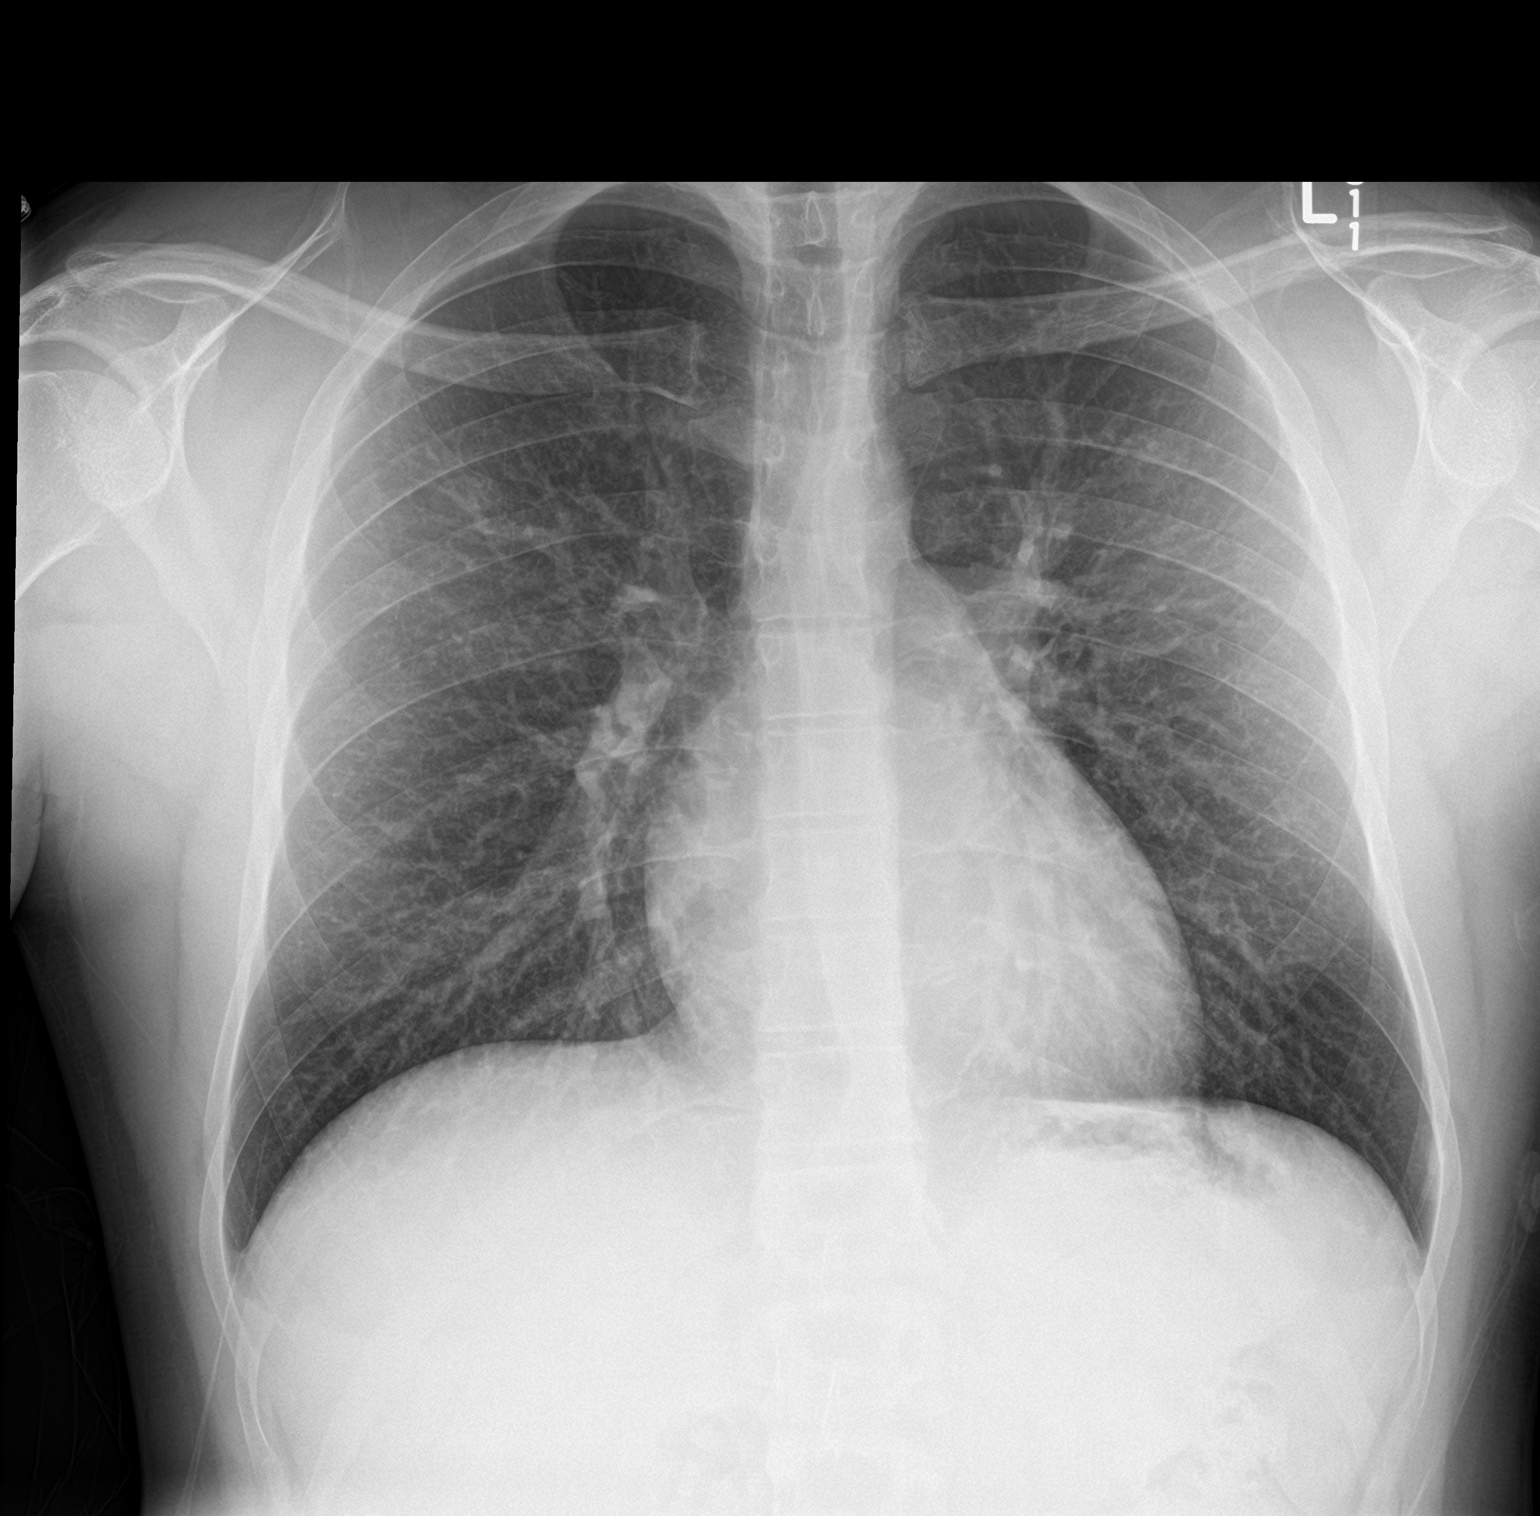

[chest lat]
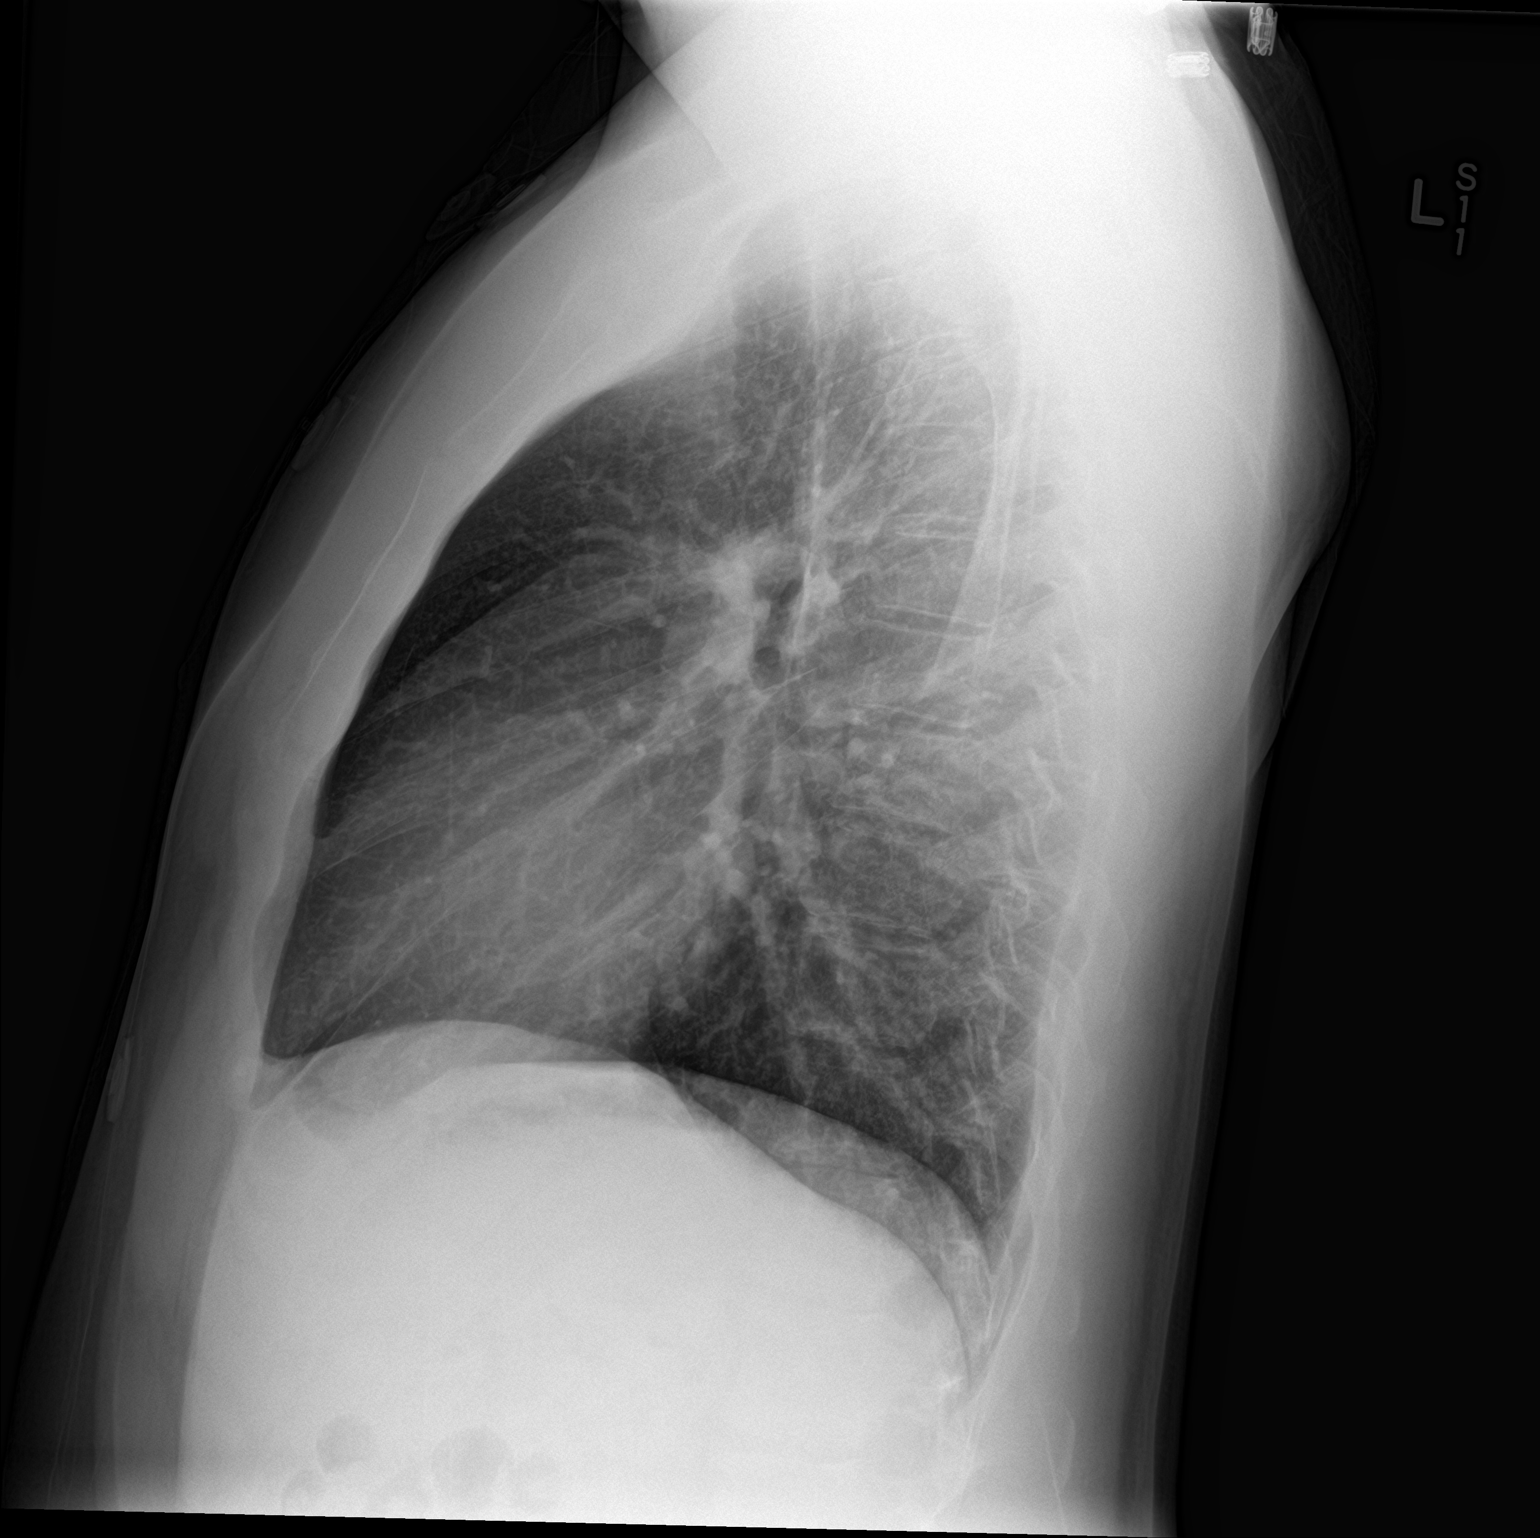

[2 of 2 positions shown; findings below may reference images not displayed]

FINDINGS: Lungs are adequately inflated without focal consolidation or
effusion. Cardiomediastinal silhouette and remainder of the exam is
unchanged.
IMPRESSION: No active cardiopulmonary disease.
